# Patient Record
Sex: Female | Born: 1995 | Hispanic: Yes | Marital: Single | Smoking: Never smoker
Health system: Southern US, Community
[De-identification: ages and names within clinical notes are randomized; demographics above are authoritative.]

## PROBLEM LIST (undated history)

## (undated) ENCOUNTER — Inpatient Hospital Stay (HOSPITAL_COMMUNITY): Payer: Self-pay

## (undated) DIAGNOSIS — Z789 Other specified health status: Secondary | ICD-10-CM

## (undated) HISTORY — PX: NO PAST SURGERIES: SHX2092

---

## 2015-02-18 NOTE — L&D Delivery Note (Signed)
Delivery Note  Pt progressed steadily through labor.  Shortly after receiving her epidural, she was C/C?+2 w/BBOW. AROM w/clear fluid.  After a 3 minute 2nd stage, at 1:54 AM a viable female was delivered via  (Presentation: ;  ).  APGAR: ROA, loose nuchal cord delivered through ; weight pending.  After 1 minutes, the cord was clamped and cut. 40 units of pitocin diluted in 1000cc LR was infused rapidly IV.  The placenta separated spontaneously and delivered via CCT and maternal pushing effort.  It was inspected and appears to be intact with a 3 VC.   Anesthesia:  epidural Episiotomy:  none Lacerations:  1st degree perineal, no sutures Suture Repair: n/a Est. Blood Loss (mL):100    Mom to postpartum.  Baby to Couplet care / Skin to Skin   The above by Dr. Evelene CroonSantos under my direct supervision. Marland Kitchen.  CRESENZO-DISHMAN,Randon Somera 12/29/2015, 1:58 AM

## 2015-05-26 ENCOUNTER — Encounter (HOSPITAL_COMMUNITY): Payer: Self-pay | Admitting: *Deleted

## 2015-05-26 ENCOUNTER — Inpatient Hospital Stay (HOSPITAL_COMMUNITY)
Admission: AD | Admit: 2015-05-26 | Discharge: 2015-05-27 | Disposition: A | Discharge status: Home / Self Care | Payer: Self-pay | Source: Ambulatory Visit | Attending: Obstetrics & Gynecology | Admitting: Obstetrics & Gynecology

## 2015-05-26 DIAGNOSIS — K529 Noninfective gastroenteritis and colitis, unspecified: Secondary | ICD-10-CM

## 2015-05-26 DIAGNOSIS — Z3491 Encounter for supervision of normal pregnancy, unspecified, first trimester: Secondary | ICD-10-CM

## 2015-05-26 DIAGNOSIS — R109 Unspecified abdominal pain: Secondary | ICD-10-CM

## 2015-05-26 DIAGNOSIS — O218 Other vomiting complicating pregnancy: Secondary | ICD-10-CM | POA: Insufficient documentation

## 2015-05-26 DIAGNOSIS — E860 Dehydration: Secondary | ICD-10-CM

## 2015-05-26 DIAGNOSIS — Z3A14 14 weeks gestation of pregnancy: Secondary | ICD-10-CM | POA: Insufficient documentation

## 2015-05-26 DIAGNOSIS — O26899 Other specified pregnancy related conditions, unspecified trimester: Secondary | ICD-10-CM

## 2015-05-26 DIAGNOSIS — O26892 Other specified pregnancy related conditions, second trimester: Secondary | ICD-10-CM | POA: Insufficient documentation

## 2015-05-26 HISTORY — DX: Other specified health status: Z78.9

## 2015-05-26 LAB — URINALYSIS, ROUTINE W REFLEX MICROSCOPIC
BILIRUBIN URINE: NEGATIVE
Glucose, UA: NEGATIVE mg/dL
Hgb urine dipstick: NEGATIVE
KETONES UR: 15 mg/dL — AB
Leukocytes, UA: NEGATIVE
Nitrite: NEGATIVE
PH: 5 (ref 5.0–8.0)
Protein, ur: NEGATIVE mg/dL
Specific Gravity, Urine: >1.03 — ABNORMAL HIGH (ref 1.005–1.030)

## 2015-05-26 LAB — POCT PREGNANCY, URINE: Preg Test, Ur: POSITIVE — AB

## 2015-05-26 NOTE — MAU Note (Addendum)
Nausea past wk and unable to eat. Feel bad. Denies vag bleeding. Sometimes sees blood when vomits from vomiting so much. Some abd pain which i think is from throwing up so much. Some cramping in lower abd last night but now just sore from vomiting. Had chills and boyfriend felt pt was hot earlier but did not take temperature

## 2015-05-27 ENCOUNTER — Encounter (HOSPITAL_COMMUNITY): Payer: Self-pay | Admitting: Student

## 2015-05-27 ENCOUNTER — Inpatient Hospital Stay (HOSPITAL_COMMUNITY): Payer: Self-pay

## 2015-05-27 DIAGNOSIS — K529 Noninfective gastroenteritis and colitis, unspecified: Secondary | ICD-10-CM

## 2015-05-27 LAB — COMPREHENSIVE METABOLIC PANEL
ALK PHOS: 62 U/L (ref 38–126)
ALT: 20 U/L (ref 14–54)
ANION GAP: 6 (ref 5–15)
AST: 22 U/L (ref 15–41)
Albumin: 4.1 g/dL (ref 3.5–5.0)
BILIRUBIN TOTAL: 0.5 mg/dL (ref 0.3–1.2)
BUN: 6 mg/dL (ref 6–20)
CO2: 23 mmol/L (ref 22–32)
Calcium: 9.4 mg/dL (ref 8.9–10.3)
Chloride: 107 mmol/L (ref 101–111)
Creatinine, Ser: 0.54 mg/dL (ref 0.44–1.00)
GFR calc Af Amer: >60 mL/min (ref >60)
GLUCOSE: 85 mg/dL (ref 65–99)
POTASSIUM: 3.6 mmol/L (ref 3.5–5.1)
Sodium: 136 mmol/L (ref 135–145)
TOTAL PROTEIN: 7.6 g/dL (ref 6.5–8.1)

## 2015-05-27 LAB — CBC
HEMATOCRIT: 39.7 % (ref 36.0–46.0)
HEMOGLOBIN: 13.9 g/dL (ref 12.0–15.0)
MCH: 29.9 pg (ref 26.0–34.0)
MCHC: 35 g/dL (ref 30.0–36.0)
MCV: 85.4 fL (ref 78.0–100.0)
Platelets: 242 10*3/uL (ref 150–400)
RBC: 4.65 MIL/uL (ref 3.87–5.11)
RDW: 12.1 % (ref 11.5–15.5)
WBC: 12.1 10*3/uL — ABNORMAL HIGH (ref 4.0–10.5)

## 2015-05-27 LAB — WET PREP, GENITAL
CLUE CELLS WET PREP: NONE SEEN
Sperm: NONE SEEN
TRICH WET PREP: NONE SEEN
Yeast Wet Prep HPF POC: NONE SEEN

## 2015-05-27 LAB — HCG, QUANTITATIVE, PREGNANCY: hCG, Beta Chain, Quant, S: 226900 m[IU]/mL — ABNORMAL HIGH (ref <5)

## 2015-05-27 LAB — HIV ANTIBODY (ROUTINE TESTING W REFLEX): HIV Screen 4th Generation wRfx: NONREACTIVE

## 2015-05-27 LAB — ABO/RH: ABO/RH(D): B POS

## 2015-05-27 MED ORDER — SODIUM CHLORIDE 0.9 % IV SOLN
8.0000 mg | Freq: Once | INTRAVENOUS | Status: AC
  Administered 2015-05-27: 8 mg via INTRAVENOUS
  Filled 2015-05-27: qty 4

## 2015-05-27 MED ORDER — DEXTROSE 5 % IN LACTATED RINGERS IV BOLUS
1000.0000 mL | Freq: Once | INTRAVENOUS | Status: AC
  Administered 2015-05-27: 1000 mL via INTRAVENOUS

## 2015-05-27 MED ORDER — PROMETHAZINE HCL 25 MG PO TABS: 25.0000 mg | ORAL_TABLET | Freq: Four times a day (QID) | ORAL | Status: DC | PRN

## 2015-05-27 NOTE — Progress Notes (Signed)
Written and verbal d/c instructions given with help of spanish interpreter Raquel, and pt voices understanding.

## 2015-05-27 NOTE — MAU Provider Note (Signed)
History     CSN: [id number]  Arrival date and time: 05/26/15 2230   First Provider Initiated Contact with Patient 05/27/15 0110       Chief Complaint  Patient presents with  . Emesis During Pregnancy  . Diarrhea  . Abdominal Cramping   HPI  Anna Freeman is a 20 y.o. G3P1001 at [redacted]w[redacted]d by LMP who presents with n/v/d & abdominal cramping. Symptoms began 2 days ago. States has vomited 12 times today. Reports 4 episodes of watery stool today. Abdominal cramping since yesterday - states feels "sore" from vomiting. Denies fever, dysuria, vaginal bleeding, vaginal discharge. States thought she had a fever today but when she checked her temp it was normal.  Has first prenatal at [facility] scheduled for next Wednesday.  Denies sick contacts.   OB History    Gravida Para Term Preterm AB TAB SAB Ectopic Multiple Living   0 0 0 0 0 0 1      Past Medical History  Diagnosis Date  . Medical history non-contributory     Past Surgical History  Procedure Laterality Date  . No past surgeries      Family History  Problem Relation Age of Onset  . Heart disease Sister     Social History  Substance Use Topics  . Smoking status: Never Smoker   . Smokeless tobacco: None  . Alcohol Use: No    Allergies: No Known Allergies  Prescriptions prior to admission  Medication Sig Dispense Refill Last Dose  . Prenatal Vit-Fe Fumarate-FA (PRENATAL MULTIVITAMIN) TABS tablet Take 1 tablet by mouth daily at 12 noon.   05/26/2015 at Unknown time    Review of Systems  Constitutional: Positive for chills. Negative for fever.  Gastrointestinal: Positive for nausea, vomiting, abdominal pain and diarrhea. Negative for heartburn, constipation and blood in stool.  Genitourinary: Negative.    Physical Exam   Blood pressure 121/86, pulse 99, temperature 97.7 F (36.5 C), resp. rate 18, height  (1.473 m), weight 114 lb 6.4 oz (51.891 kg), last menstrual period 02/17/2015.  Physical Exam   Nursing note and vitals reviewed. Constitutional: She is oriented to person, place, and time. She appears well-developed and well-nourished. No distress.  HENT:  Head: Normocephalic and atraumatic.  Eyes: Conjunctivae are normal. Right eye exhibits no discharge. Left eye exhibits no discharge. No scleral icterus.  Neck: Normal range of motion.  Cardiovascular: Normal rate, regular rhythm and normal heart sounds.   No murmur heard. Respiratory: Effort normal and breath sounds normal. No respiratory distress. She has no wheezes.  GI: Soft. Bowel sounds are normal. She exhibits no distension. There is no tenderness. There is no rebound and no guarding.  Genitourinary: Uterus is enlarged. Uterus is not tender. Cervix exhibits no motion tenderness.  Cervix closed  Neurological: She is alert and oriented to person, place, and time.  Skin: Skin is warm and dry. She is not diaphoretic.  Psychiatric: She has a normal mood and affect. Her behavior is normal. Judgment and thought content normal.    MAU Course  Procedures Results for orders placed or performed during the hospital encounter of 05/26/15 (from the past 24 hour(s))  Urinalysis, Routine w reflex microscopic (not at [facility])     Status: Abnormal   Collection Time: 05/26/15 11:00 PM  Result Value Ref Range   Color, Urine YELLOW YELLOW   APPearance CLEAR CLEAR   Specific Gravity, Urine >1.030 (H) 1.005 - 1.030   pH 5.0 5.0 -  8.0   Glucose, UA NEGATIVE NEGATIVE mg/dL   Hgb urine dipstick NEGATIVE NEGATIVE   Bilirubin Urine NEGATIVE NEGATIVE   Ketones, ur 15 (A) NEGATIVE mg/dL   Protein, ur NEGATIVE NEGATIVE mg/dL   Nitrite NEGATIVE NEGATIVE   Leukocytes, UA NEGATIVE NEGATIVE  Pregnancy, urine POC     Status: Abnormal   Collection Time: 05/26/15 11:03 PM  Result Value Ref Range   Preg Test, Ur POSITIVE (A) NEGATIVE  CBC     Status: Abnormal   Collection Time: 05/27/15  1:35 AM  Result Value Ref Range   WBC 12.1 (H) 4.0 - 10.5 K/uL    RBC 4.65 3.87 - 5.11 MIL/uL   Hemoglobin 13.9 12.0 - 15.0 g/dL   HCT 16.139.7 09.636.0 - 04.546.0 %   MCV 85.4 78.0 - 100.0 fL   MCH 29.9 26.0 - 34.0 pg   MCHC 35.0 30.0 - 36.0 g/dL   RDW 40.912.1 81.111.5 - 91.415.5 %   Platelets 242 150 - 400 K/uL  ABO/Rh     Status: None (Preliminary result)   Collection Time: 05/27/15  1:35 AM  Result Value Ref Range   ABO/RH(D) B POS   hCG, quantitative, pregnancy     Status: Abnormal   Collection Time: 05/27/15  1:35 AM  Result Value Ref Range   hCG, Beta Chain, Quant, S [id number] (H) <5 mIU/mL  Comprehensive metabolic panel     Status: None   Collection Time: 05/27/15  1:35 AM  Result Value Ref Range   Sodium 136 135 - 145 mmol/L   Potassium 3.6 3.5 - 5.1 mmol/L   Chloride 107 101 - 111 mmol/L   CO2 23 22 - 32 mmol/L   Glucose, Bld 85 65 - 99 mg/dL   BUN 6 6 - 20 mg/dL   Creatinine, Ser 2.130.54 0.44 - 1.00 mg/dL   Calcium 9.4 8.9 - 08.610.3 mg/dL   Total Protein 7.6 6.5 - 8.1 g/dL   Albumin 4.1 3.5 - 5.0 g/dL   AST 22 15 - 41 U/L   ALT 20 14 - 54 U/L   Alkaline Phosphatase 62 38 - 126 U/L   Total Bilirubin 0.5 0.3 - 1.2 mg/dL   GFR calc non Af Amer >60 >60 mL/min   GFR calc Af Amer >60 >60 mL/min   Anion gap 6 5 - 15  Wet prep, genital     Status: Abnormal   Collection Time: 05/27/15  2:54 AM  Result Value Ref Range   Yeast Wet Prep HPF POC NONE SEEN NONE SEEN   Trich, Wet Prep NONE SEEN NONE SEEN   Clue Cells Wet Prep HPF POC NONE SEEN NONE SEEN   WBC, Wet Prep HPF POC MODERATE (A) NONE SEEN   Sperm NONE SEEN    [place] Ob Comp Less 14 Wks  05/27/2015  CLINICAL DATA:  Nausea vomiting chills and cramping abdominal pain. EXAM: OBSTETRIC <14 WK US AND TRANSVAGINAL OB US TECHNIQUE: Both transabdominal and transvaginal ultrasound examinations were performed for complete evaluation of the gestation as well as the maternal uterus, adnexal regions, and pelvic cul-de-sac. Transvaginal technique was performed to assess early pregnancy. COMPARISON:  None. FINDINGS:  Intrauterine gestational sac: Single Yolk sac:  Yes Embryo:  Yes Cardiac Activity: Yes Heart Rate: 180  bpm MSD:   mm    w     d CRL:  23.4  mm   9 w   0 d  [place] EDC: 12/30/2015 Subchorionic hemorrhage:  None visualized. Maternal uterus/adnexae: Normal.  No abnormal fluid collections. IMPRESSION: Single living intrauterine gestation measuring 9 weeks 0 days by crown-rump length. Electronically Signed   By: Ellery Plunk M.D.   On: 05/27/2015 02:38   US Ob Transvaginal  05/27/2015  CLINICAL DATA:  Nausea vomiting chills and cramping abdominal pain. EXAM: OBSTETRIC <14 WK [place] AND TRANSVAGINAL OB US TECHNIQUE: Both transabdominal and transvaginal ultrasound examinations were performed for complete evaluation of the gestation as well as the maternal uterus, adnexal regions, and pelvic cul-de-sac. Transvaginal technique was performed to assess early pregnancy. COMPARISON:  None. FINDINGS: Intrauterine gestational sac: Single Yolk sac:  Yes Embryo:  Yes Cardiac Activity: Yes Heart Rate: 180  bpm MSD:   mm    w     d CRL:  23.4  mm   9 w   0 d                  [place] EDC: 12/30/2015 Subchorionic hemorrhage:  None visualized. Maternal uterus/adnexae: Normal.  No abnormal fluid collections. IMPRESSION: Single living intrauterine gestation measuring 9 weeks 0 days by crown-rump length. Electronically Signed   By: Ellery Plunk M.D.   On: 05/27/2015 02:38    MDM UPT positive IV D5LR bolus Zofran 8 mg IV Unable to doppler FHT, will send for ultrasound Ultrasound shows SIUP at 9 weeks - will update EDD Pt not observed vomiting or having diarrhea while in MAU Assessment and Plan  A: 1. Acute gastroenteritis   2. Abdominal pain in pregnancy   3. Normal IUP (intrauterine pregnancy) on prenatal ultrasound, first trimester   4. Dehydration     P: Discharge home Rx phenergan Keep f/u with [facility] Discussed reasons to return to MAU Advance diet as tolerated  Judeth Horn 05/27/2015, 1:02 AM

## 2015-05-27 NOTE — Progress Notes (Signed)
Assisted Registration with interpretation of questions for admission.  Also assisted RN with interpretation of triage questions.  Spanish Interpreter

## 2015-05-27 NOTE — Progress Notes (Signed)
'  blind swab' by NP to obtain specimens. Anna Freeman spanish interpreter helping

## 2015-05-27 NOTE — Discharge Instructions (Signed)
Gastroenteritis viral (Viral Gastroenteritis) La gastroenteritis viral tambin es conocida como gripe del [place]. Este trastorno [employer] y el tubo digestivo. Puede causar diarrea y vmitos repentinos. La enfermedad generalmente dura entre 3 y [address]. La [employer] de las personas desarrolla una respuesta inmunolgica. Con el tiempo, esto elimina el virus. Mientras se desarrolla esta respuesta natural, el virus puede afectar en forma importante su salud.  CAUSAS Muchos virus diferentes pueden causar gastroenteritis, por ejemplo el rotavirus o el norovirus. Estos virus pueden contagiarse al consumir alimentos o agua contaminados. Tambin puede contagiarse al compartir utensilios u otros artculos personales con una persona infectada o al tocar una superficie contaminada.  SNTOMAS Los sntomas ms comunes son diarrea y vmitos. Estos problemas pueden causar una prdida grave de lquidos corporales(deshidratacin) y un desequilibrio de sales corporales(electrolitos). Otros sntomas pueden ser:   Grant Ruts.  Dolor de [place].  Fatiga.  Dolor abdominal. DIAGNSTICO  El mdico podr hacer el diagnstico de gastroenteritis viral basndose en los sntomas y el examen fsico Tambin pueden tomarle una muestra de materia fecal para diagnosticar la presencia de virus u otras infecciones.  TRATAMIENTO Esta enfermedad generalmente desaparece sin tratamiento. Los tratamientos estn dirigidos a Social research officer, government. Los casos ms graves de gastroenteritis viral implican vmitos tan intensos que no es posible retener lquidos. En [employer], los lquidos deben administrarse a travs de una va intravenosa (IV).  INSTRUCCIONES PARA EL CUIDADO DOMICILIARIO  Beba suficientes lquidos para mantener la orina clara o de color amarillo plido. Beba pequeas cantidades de lquido con frecuencia y aumente la cantidad segn la tolerancia.  Pida instrucciones especficas a su mdico con respecto a la  rehidratacin.  Evite:  Alimentos que Nurse, adult.  Alcohol.  Gaseosas.  TabacoVista Lawman.  Bebidas con cafena.  Lquidos muy calientes o fros.  Alimentos muy grasos.  Comer demasiado a Licensed conveyancer.  Productos lcteos hasta 24 a 48 horas despus de que se detenga la diarrea.  Puede consumir probiticos. Los probiticos son cultivos activos de bacterias beneficiosas. Pueden disminuir la cantidad y el nmero de deposiciones diarreicas en el adulto. Se encuentran en los yogures con cultivos activos y en los suplementos.  Lave bien sus manos para evitar que se disemine el virus.  Slo tome medicamentos de venta libre o recetados para Primary school teacher, las molestias o bajar la fiebre segn las indicaciones de su mdico. No administre aspirina a los nios. Los medicamentos antidiarreicos no son recomendables.  Consulte a su mdico si puede seguir tomando sus medicamentos recetados o de [employer].  Cumpla con todas las visitas de control, segn le indique su mdico. SOLICITE ATENCIN MDICA DE INMEDIATO SI:  No puede retener lquidos.  No hay emisin de orina durante 6 a 8 horas.  Le falta el aire.  Observa sangre en el vmito (se ve como caf molido) o en la materia fecal.  Siente dolor abdominal que empeora o se concentra en una zona pequea (se localiza).  Tiene nuseas o vmitos persistentes.  Tiene fiebre.  El paciente es un nio menor de 3 meses y [place].  El paciente es un nio mayor de 3 meses, tiene fiebre y sntomas persistentes.  El paciente es un nio mayor de 3 meses y tiene fiebre y sntomas que empeoran repentinamente.  El paciente es un beb y no tiene lgrimas cuando llora. ASEGRESE QUE:   Comprende estas instrucciones.  Controlar su enfermedad.  Solicitar ayuda inmediatamente si no mejora o si empeora.   Esta  informacin no tiene Theme park managercomo fin reemplazar el consejo del mdico. Asegrese de hacerle al mdico cualquier pregunta que  tenga.   Document Released: 02/03/2005 Document Revised: 04/28/2011 Elsevier Interactive Patient Education 2016 [employer]   Dolor abdominal durante el embarazo (Abdominal Pain During Pregnancy) El dolor de vientre (abdominal) es habitual durante el embarazo. Generalmente no se trata de un problema grave. Otras veces puede ser un signo de que algo no anda bien. Siempre comunquese con su mdico si tiene dolor abdominal. CUIDADOS EN EL HOGAR Controle el dolor para ver si hay cambios. Las indicaciones que siguen pueden ayudarla a sentirse mejor:  Hospital doctorotenga sexo (relaciones sexuales) ni se coloque nada dentro de la vagina hasta que se sienta mejor.  Haga reposo hasta que el dolor se calme.  Si siente ganas de vomitar (nuseas ) beba lquidos claros. No consuma alimentos slidos hasta que se sienta mejor.  Slo tome los medicamentos que le haya indicado su mdico.  Cumpla con las visitas al mdico segn las indicaciones. SOLICITE AYUDA DE INMEDIATO SI:   Tiene un sangrado, pierde lquido o elimina trozos de tejido por la vagina.  Siente ms dolor o clicos.  Comienza a vomitar.  Siente dolor al orinar u observa sangre en la orina.  Tiene fiebre.  No siente que el beb se mueva mucho.  Se siente muy dbil o cree que va a desmayarse.  Tiene dificultad para respirar con o sin dolor en el vientre.  Siente un dolor de cabeza muy intenso y Engineer, miningdolor en el vientre.  Observa que sale un lquido por la vagina y tiene dolor abdominal.  La materia fecal es lquida (diarrea).  El dolor en el viente no desaparece, o empeora, luego de hacer reposo. ASEGRESE DE QUE:   Comprende estas instrucciones.  Controlar su afeccin.  Recibir ayuda de inmediato si no mejora o si empeora.   Esta informacin no tiene Theme park managercomo fin reemplazar el consejo del mdico. Asegrese de hacerle al mdico cualquier pregunta que tenga.   Document Released: 10/16/2010 Document Revised: 10/06/2012 Elsevier  Interactive Patient Education [employer]2016 Elsevier Inc.

## 2015-05-28 LAB — GC/CHLAMYDIA PROBE AMP (~~LOC~~) NOT AT ARMC
Chlamydia: POSITIVE — AB
Neisseria Gonorrhea: NEGATIVE

## 2015-05-29 ENCOUNTER — Telehealth (HOSPITAL_COMMUNITY): Payer: Self-pay | Admitting: *Deleted

## 2015-05-29 DIAGNOSIS — A749 Chlamydial infection, unspecified: Secondary | ICD-10-CM

## 2015-05-29 DIAGNOSIS — O98811 Other maternal infectious and parasitic diseases complicating pregnancy, first trimester: Principal | ICD-10-CM

## 2015-05-29 MED ORDER — AZITHROMYCIN 500 MG PO TABS: ORAL_TABLET | ORAL | Status: DC

## 2015-05-29 NOTE — Telephone Encounter (Signed)
Telephone call to patient using Education officer, communityacific Interpreter # [phone].  Notified patient of positive chlamydia culture.  Verified partner has no drug allergies.  Rx routed to patient's pharmacy per protocol with one refill for partner's treatment.  Instructed patient to abstain from sex for seven days post treatment.  Report faxed to health department.

## 2015-06-07 ENCOUNTER — Other Ambulatory Visit (HOSPITAL_COMMUNITY): Payer: Self-pay | Admitting: Nurse Practitioner

## 2015-06-07 DIAGNOSIS — Z3A13 13 weeks gestation of pregnancy: Secondary | ICD-10-CM

## 2015-06-07 DIAGNOSIS — Z369 Encounter for antenatal screening, unspecified: Secondary | ICD-10-CM

## 2015-06-07 LAB — OB RESULTS CONSOLE ABO/RH: RH TYPE: POSITIVE

## 2015-06-07 LAB — OB RESULTS CONSOLE HEPATITIS B SURFACE ANTIGEN
HEP B S AG: UNDETERMINED
Hepatitis B Surface Ag: NEGATIVE

## 2015-06-07 LAB — OB RESULTS CONSOLE ANTIBODY SCREEN: Antibody Screen: NEGATIVE

## 2015-06-07 LAB — OB RESULTS CONSOLE RUBELLA ANTIBODY, IGM: Rubella: IMMUNE

## 2015-06-07 LAB — OB RESULTS CONSOLE HIV ANTIBODY (ROUTINE TESTING): HIV: NONREACTIVE

## 2015-06-07 LAB — OB RESULTS CONSOLE RPR: RPR: NONREACTIVE

## 2015-06-15 ENCOUNTER — Encounter (HOSPITAL_COMMUNITY): Payer: Self-pay | Admitting: Nurse Practitioner

## 2015-06-26 ENCOUNTER — Encounter (HOSPITAL_COMMUNITY): Payer: Self-pay

## 2015-06-26 ENCOUNTER — Ambulatory Visit (HOSPITAL_COMMUNITY)
Admission: RE | Admit: 2015-06-26 | Discharge: 2015-06-26 | Disposition: A | Discharge status: Home / Self Care | Payer: Self-pay | Source: Ambulatory Visit | Attending: Nurse Practitioner | Admitting: Nurse Practitioner

## 2015-06-26 VITALS — BP 116/82 | HR 84 | Wt 111.0 lb

## 2015-06-26 DIAGNOSIS — O352XX0 Maternal care for (suspected) hereditary disease in fetus, not applicable or unspecified: Secondary | ICD-10-CM | POA: Insufficient documentation

## 2015-06-26 DIAGNOSIS — Z36 Encounter for antenatal screening of mother: Secondary | ICD-10-CM | POA: Insufficient documentation

## 2015-06-26 DIAGNOSIS — Z8249 Family history of ischemic heart disease and other diseases of the circulatory system: Secondary | ICD-10-CM | POA: Insufficient documentation

## 2015-06-26 DIAGNOSIS — O352XX1 Maternal care for (suspected) hereditary disease in fetus, fetus 1: Secondary | ICD-10-CM

## 2015-06-26 DIAGNOSIS — Z3A13 13 weeks gestation of pregnancy: Secondary | ICD-10-CM | POA: Insufficient documentation

## 2015-06-26 DIAGNOSIS — Z315 Encounter for genetic counseling: Secondary | ICD-10-CM | POA: Insufficient documentation

## 2015-06-26 DIAGNOSIS — Z369 Encounter for antenatal screening, unspecified: Secondary | ICD-10-CM

## 2015-06-26 NOTE — Progress Notes (Signed)
Genetic Counseling  Visit Summary Note  Appointment Date: 06/26/2015 Referred By: Nicholaus Bloom, NP  Date of Birth: 07-Aug-1995  Pregnancy history: G3P1001 Estimated Date of Delivery: 12/30/15 Estimated Gestational Age: [redacted]w[redacted]d I met with [name] genetic counseling because of a family history of a congenital heart defect.  In summary:  Discussed family history of CHD  Reviewed probable multifactorial etiology and the associated 1-2% risk of recurrence  Discussed options of screening / testing  Patient elected to have an NT with First trimester screening today  She is scheduled to return for a detailed anatomy u/s and fetal echocardiogram  Discussed general population carrier screening options  Hemoglobinopathies-screening declined  We began by reviewing the family history in detail. [name] that her sister was noted shortly after birth to have a heart defect, specific type unknown. [name] that her sister was followed yearly by a cardiologist and surgery was planned for late adolescence; however, prior to that time, the difference apparently resolved.  This sibling is now 180years old and has no health concerns. She is said to have normal intellect and physical appearance. Medical records were not available to verify the reported history. The family histories were otherwise found to be noncontributory for birth defects, intellectual disability, and known genetic conditions. Without further information regarding the provided family history, an accurate genetic risk cannot be calculated. Further genetic counseling is warranted if more information is obtained.  Ms. Anna Altamiranowas counseled that congenital heart defects occur at a frequency of approximately 1 in 100 births, when minor cases are included in the calculation of disease incidence. We discussed that congenital heart defects can be genetic, environmental, or  multifactorial in etiology. This patient was counseled that congenital heart defects are prominent in chromosome conditions, particularly autosomal trisomies, Turner syndrome, and microdeletions (22q11 deletion syndrome), with approximately 15-20% of congenital heart defects resulting from an underlying chromosome condition. In addition, single gene conditions are also a common genetic cause of congenital heart defects. To date, more than 500 single gene disorders are known to have congenital heart disease as a described feature.   Environmental causes for congenital heart defects are well described and many teratogens are known to be linked to congenital heart disease (alcohol, specific medications, Rubella). The majority of the remainder of cases (>70%) are classified as multifactorial, referring to the condition being caused by a combination of both environmental and genetic causes. Given that the remainder of [name] medical history is noncontributory, she has no developmental or behavioral concerns, and has no described dysmorphisms, we discussed that it is most likely that her congenital heart difference was nonsyndromic. We reviewed multifactorial inheritance and that literature supports that the risk of recurrence for second degree relatives for an unspecified congenital heart defect is approximately 1-2%. She was counseled that if her sister has a specific genetic condition that is currently undiagnosed, the risk of recurrence depends on the inheritance of the condition.   We reviewed the availability of a nuchal translucency ultrasound, a detailed ultrasound, and fetal echocardiogram. We reviewed that fetuses with an increased nuchal translucency measurement have a higher risk of congenital heart disease, thus this ultrasound measurement provides early screening for congenital heart defects.  After thoughtful consideration of her options, [name] to have a  nuchal translucency ultrasound and First trimester screening today.  She also elected to return for a detailed anatomy ultrasound and fetal echocardiogram. These  appointments were scheduled today.    [name] was provided with written information regarding sickle cell anemia (SCA) including the carrier frequency and incidence in the Hispanic population, the availability of carrier testing and prenatal diagnosis if indicated.  In addition, we discussed that hemoglobinopathies are routinely screened for as part of the [place] newborn screening panel.  She declined hemoglobin electrophoresis today.   [name] denied exposure to environmental toxins or chemical agents. She denied the use of alcohol, tobacco or street drugs. She denied significant viral illnesses during the course of her pregnancy. Her medical and surgical histories were noncontributory.   I counseled this patient regarding the above risks and available options.  The approximate face-to-face time with the genetic counselor was 38 minutes.  Filbert Schilder, MS Certified Genetic Counselor

## 2015-07-10 ENCOUNTER — Other Ambulatory Visit (HOSPITAL_COMMUNITY): Payer: Self-pay

## 2015-07-31 ENCOUNTER — Inpatient Hospital Stay (HOSPITAL_COMMUNITY): Payer: Self-pay

## 2015-07-31 ENCOUNTER — Inpatient Hospital Stay (HOSPITAL_COMMUNITY)
Admission: AD | Admit: 2015-07-31 | Discharge: 2015-07-31 | Disposition: A | Discharge status: Home / Self Care | Payer: Self-pay | Source: Ambulatory Visit | Attending: Obstetrics and Gynecology | Admitting: Obstetrics and Gynecology

## 2015-07-31 ENCOUNTER — Ambulatory Visit (HOSPITAL_COMMUNITY): Admission: RE | Admit: 2015-07-31 | Payer: Self-pay | Source: Ambulatory Visit

## 2015-07-31 ENCOUNTER — Encounter (HOSPITAL_COMMUNITY): Payer: Self-pay | Admitting: *Deleted

## 2015-07-31 DIAGNOSIS — Z1389 Encounter for screening for other disorder: Secondary | ICD-10-CM

## 2015-07-31 DIAGNOSIS — R102 Pelvic and perineal pain: Secondary | ICD-10-CM | POA: Insufficient documentation

## 2015-07-31 DIAGNOSIS — O4692 Antepartum hemorrhage, unspecified, second trimester: Secondary | ICD-10-CM

## 2015-07-31 DIAGNOSIS — O26892 Other specified pregnancy related conditions, second trimester: Secondary | ICD-10-CM | POA: Insufficient documentation

## 2015-07-31 DIAGNOSIS — O209 Hemorrhage in early pregnancy, unspecified: Secondary | ICD-10-CM | POA: Insufficient documentation

## 2015-07-31 DIAGNOSIS — Z3A18 18 weeks gestation of pregnancy: Secondary | ICD-10-CM | POA: Insufficient documentation

## 2015-07-31 DIAGNOSIS — Z3689 Encounter for other specified antenatal screening: Secondary | ICD-10-CM

## 2015-07-31 DIAGNOSIS — Z8279 Family history of other congenital malformations, deformations and chromosomal abnormalities: Secondary | ICD-10-CM | POA: Insufficient documentation

## 2015-07-31 LAB — URINALYSIS, ROUTINE W REFLEX MICROSCOPIC
Bilirubin Urine: NEGATIVE
Glucose, UA: NEGATIVE mg/dL
Ketones, ur: NEGATIVE mg/dL
Leukocytes, UA: NEGATIVE
Nitrite: NEGATIVE
PROTEIN: NEGATIVE mg/dL
Specific Gravity, Urine: 1.015 (ref 1.005–1.030)
pH: 6 (ref 5.0–8.0)

## 2015-07-31 LAB — URINE MICROSCOPIC-ADD ON
Bacteria, UA: NONE SEEN
WBC, UA: NONE SEEN WBC/hpf (ref 0–5)

## 2015-07-31 LAB — WET PREP, GENITAL
Clue Cells Wet Prep HPF POC: NONE SEEN
Sperm: NONE SEEN
Trich, Wet Prep: NONE SEEN
YEAST WET PREP: NONE SEEN

## 2015-07-31 NOTE — Discharge Instructions (Signed)
Hemorragia vaginal durante el embarazo (segundo trimestre) (Vaginal Bleeding During Pregnancy, Second Trimester) Durante el embarazo es relativamente frecuente que se presente una pequea hemorragia (manchas). Esta situacin generalmente mejora por s misma. Hay diversos factores que pueden causar hemorragia o Games developer. Algunas manchas pueden estar relacionadas al [employer] y otras no. A veces, la hemorragia es normal y no es un problema. Sin embargo, la hemorragia tambin puede ser un signo de algo grave. Debe informar a su mdico de inmediato si tiene alguna hemorragia vaginal. Algunas causas posibles de hemorragia vaginal durante el segundo trimestre incluyen:  Infeccin, inflamacin o crecimientos anormales en el cuello del tero.  La placenta cubre parcial o completamente la abertura del cuello del tero (placenta previa).  La placenta puede haberse separado del tero (abrupcin placentaria).  Usted puede estar sufriendo un trabajo de parto prematuro.  Es probable que el cuello del tero no sea lo suficientemente resistente para Pharmacologist un beb dentro del tero (insuficiencia cervical).  Se pueden haber desarrollado pequeos quistes en el tero en lugar de tejido de embarazo (embarazo molar). INSTRUCCIONES PARA EL CUIDADO EN EL HOGAR  Controle su afeccin para ver si hay cambios. Las siguientes indicaciones ayudarn a Psychologist, educational [employer] pueda sentir:  Siga las indicaciones del mdico para restringir su actividad. Si el mdico le indica descanso en la cama, debe quedarse en la cama y levantarse solo para ir al bao. No obstante, el mdico puede permitirle que continu con tareas livianas.  Si es necesario, organcese para que alguien le ayude con las actividades y responsabilidades cotidianas mientras est en cama.  Lleve un registro de la cantidad y la saturacin de las toallas higinicas que Landscape architect. Anote este dato.  No use tampones.No se haga duchas  vaginales.  No tenga relaciones sexuales u orgasmos hasta que el mdico la autorice.  Si elimina tejido por la vagina, gurdelo para mostrrselo al [employer].  [place] solo medicamentos de venta libre o recetados, segn las indicaciones del mdico.  No tome aspirina, ya que puede causar hemorragias.  No realice ejercicio ni ninguna actividad intensa, ni levante objetos pesados sin el permiso de su mdico.  Cumpla con todas las visitas de control, segn le indique su mdico. SOLICITE ATENCIN MDICA SI:  Tiene un sangrado vaginal en cualquier momento del embarazo.  Tiene calambres o dolores de [place].  Tiene fiebre que los medicamentos no Sports coach. SOLICITE ATENCIN MDICA DE INMEDIATO SI:   Siente calambres intensos en la espalda o en el vientre (abdomen).  Siente contracciones.  Tiene escalofros.  Elimina cogulos grandes o tejido por la vagina.  La hemorragia aumenta.  Si se siente mareada, dbil o se desmaya.  Tiene una prdida importante o sale lquido a borbotones por la vagina. ASEGRESE DE QUE:  Comprende estas instrucciones.  Controlar su afeccin.  Recibir ayuda de inmediato si no mejora o si empeora.   Esta informacin no tiene Theme park manager el consejo del mdico. Asegrese de hacerle al mdico cualquier pregunta que tenga.   Document Released: 11/13/2004 Document Revised: 02/08/2013 Elsevier Interactive Patient Education 2016 [employer]. Reposo plvico  (Pelvic Rest) El reposo plvico se recomienda a las mujeres cuando:   La placenta cubre parcial o completamente la abertura del cuello del tero (placenta previa).  Hay sangrado entre la pared del tero y el saco amnitico en el primer trimestre (hemorragia subcorinica).  El cuello uterino comienza a abrirse sin iniciarse el trabajo de parto (cuello uterino incompetente, insuficiencia cervical).  El Smileytrabajo de parto se inicia muy pronto (parto prematuro). INSTRUCCIONES PARA EL CUIDADO  EN EL HOGAR   No tenga relaciones sexuales, estimulacin, ni orgasmos.  No use tampones, no se haga duchas vaginales ni coloque ningn objeto en la vagina.  No levante objetos que pesen ms de 10 libras (4,5 kg).  Evite las actividades extenuantes o tensionar los msculos de la pelvis. SOLICITE ATENCIN MDICA SI:   Tiene un sangrado vaginal durante el embarazo. Considrelo como una posible emergencia.  Siente clicos en la zona baja del estmago (ms fuertes que los clicos menstruales).  Nota flujo vaginal (acuoso, con moco o [place]).  Siente un dolor en la espalda leve y sordo.  Tiene contracciones regulares o endurecimiento del tero. SOLICITE ATENCIN MDICA DE INMEDIATO SI:  Observa sangrado vaginal y tiene placenta previa.    Esta informacin no tiene Theme park managercomo fin reemplazar el consejo del mdico. Asegrese de hacerle al mdico cualquier pregunta que tenga.   Document Released: 10/29/2011 Elsevier Interactive Patient Education [employer]2016 Elsevier Inc.

## 2015-07-31 NOTE — MAU Note (Signed)
PT SAYS WHEN SHE  WENT TO B-ROOM  IN MAU -  WIPED  - SHE SAW RED BLOOD   ON PAPER.           PAIN IN ABD  STARTED LAST WEEK-  YESTERDAY PAIN BECAME   WORSE.       PAIN IS 5.   LAST SEX-   Sunday NIGHT.  PNC-  HD     NO BLEEDING ON PERINEUM  NOW.     USING  DEBBIE- INTERPRETER.

## 2015-07-31 NOTE — MAU Provider Note (Signed)
History     CSN: [id number]  Arrival date and time: 07/31/15 0554  First patient contact 0630 Spanish interpreter here for visit    Chief Complaint  Patient presents with  . Abdominal Pain  . Vaginal Bleeding   HPI Comments: Anna Freeman is a 20 y.o. G3P1011 at [redacted]w[redacted]d presenting with one week history of suprapubic cramping pain and onset this morning of vaginal bleeding. Last intercourse 2 nights ago. Anatomic [place] is scheduled for later today.    Abdominal Pain This is a recurrent problem. The current episode started 1 to 4 weeks ago. The onset quality is gradual. The problem occurs intermittently. The problem has been unchanged. The pain is located in the suprapubic region (No pain at present). The pain is at a severity of 3/10. The pain is mild. The quality of the pain is colicky. The abdominal pain does not radiate. Associated symptoms include nausea. Pertinent negatives include no dysuria, fever, frequency, headaches or hematuria. Nothing aggravates the pain. The pain is relieved by nothing. She has tried nothing for the symptoms.  Vaginal Bleeding The patient's primary symptoms include pelvic pain and vaginal bleeding. The patient's pertinent negatives include no genital itching, genital lesions, genital odor or vaginal discharge. This is a new problem. The current episode started today. The problem occurs intermittently. The problem has been unchanged. The pain is mild. The problem affects both sides. Associated symptoms include abdominal pain and nausea. Pertinent negatives include no back pain, chills, dysuria, fever, flank pain, frequency, headaches, hematuria or urgency. The vaginal discharge was normal. The vaginal bleeding is spotting (dark red mucusy blood ). She has not been passing clots. She has not been passing tissue.   Pregnancy Course:  Prenatal care at [facility] significant for + chlamydia which was and family hx CHD> had genetic counseling and nl NT. BEGA by CRL at  [redacted]w[redacted]d. B pos blood type.   OB History  Gravida Para Term Preterm AB SAB TAB Ectopic Multiple Living  3 0 0 0 1 1 0 0 0 1     # Outcome Date GA Lbr Len/2nd Weight Sex Delivery Anes PTL Lv  3 Current           2 Gravida      Vag-Spont     1 SAB                Past Medical History  Diagnosis Date  . Medical history non-contributory     Past Surgical History  Procedure Laterality Date  . No past surgeries      Family History  Problem Relation Age of Onset  . Heart disease Sister     Social History  Substance Use Topics  . Smoking status: Never Smoker   . Smokeless tobacco: None  . Alcohol Use: No    Allergies: No Known Allergies  Prescriptions prior to admission  Medication Sig Dispense Refill Last Dose  . Prenatal Vit-Fe Fumarate-FA (PRENATAL MULTIVITAMIN) TABS tablet Take 1 tablet by mouth daily at 12 noon.   07/30/2015 at Unknown time  . azithromycin (ZITHROMAX) 500 MG tablet Take two tablets by mouth once (Patient not taking: Reported on 06/26/2015) 2 tablet 1 Not Taking  . promethazine (PHENERGAN) 25 MG tablet Take 1 tablet (25 mg total) by mouth every 6 (six) hours as needed for nausea or vomiting. (Patient not taking: Reported on 07/31/2015) 30 tablet 0 Taking    Review of Systems  Constitutional: Negative for fever, chills and malaise/fatigue.  Gastrointestinal: Positive for  nausea and abdominal pain.  Genitourinary: Positive for vaginal bleeding and pelvic pain. Negative for dysuria, urgency, frequency, hematuria, flank pain and vaginal discharge.  Musculoskeletal: Negative for back pain.  Neurological: Negative for headaches.   Physical Exam   Blood pressure 113/65, pulse 76, temperature 99.3 F (37.4 C), temperature source Oral, resp. rate 18, last menstrual period 02/17/2015.  Physical Exam  Nursing note and vitals reviewed. Constitutional: She is oriented to person, place, and time. She appears well-developed and well-nourished. She appears distressed.   HENT:  Head: Normocephalic.  Neck: Neck supple.  Cardiovascular: Normal rate.   Respiratory: Effort normal.  GI: Soft. There is no tenderness.  DT FHR 155; S=D  Genitourinary: Vagina normal. No vaginal discharge found.  Scant dark blood in vault; no active bleeding or discharge noted. No lesions. VE: cx long/closed  Neurological: She is alert and oriented to person, place, and time.  Skin: Skin is warm and dry.    MAU Course  Procedures Results for orders placed or performed during the hospital encounter of 07/31/15 (from the past 24 hour(s))  Urinalysis, Routine w reflex microscopic (not at [facility])     Status: Abnormal   Collection Time: 07/31/15  6:22 AM  Result Value Ref Range   Color, Urine YELLOW YELLOW   APPearance CLEAR CLEAR   Specific Gravity, Urine 1.015 1.005 - 1.030   pH 6.0 5.0 - 8.0   Glucose, UA NEGATIVE NEGATIVE mg/dL   Hgb urine dipstick LARGE (A) NEGATIVE   Bilirubin Urine NEGATIVE NEGATIVE   Ketones, ur NEGATIVE NEGATIVE mg/dL   Protein, ur NEGATIVE NEGATIVE mg/dL   Nitrite NEGATIVE NEGATIVE   Leukocytes, UA NEGATIVE NEGATIVE  Urine microscopic-add on     Status: Abnormal   Collection Time: 07/31/15  6:22 AM  Result Value Ref Range   Squamous Epithelial / LPF 0-5 (A) NONE SEEN   WBC, UA NONE SEEN 0 - 5 WBC/hpf   RBC / HPF 0-5 0 - 5 RBC/hpf   Bacteria, UA NONE SEEN NONE SEEN  Wet prep, genital     Status: Abnormal   Collection Time: 07/31/15  6:30 AM  Result Value Ref Range   Yeast Wet Prep HPF POC NONE SEEN NONE SEEN   Trich, Wet Prep NONE SEEN NONE SEEN   Clue Cells Wet Prep HPF POC NONE SEEN NONE SEEN   WBC, Wet Prep HPF POC FEW (A) NONE SEEN   Sperm NONE SEEN   GC/CT sent  No results found. Full anatomic US done. Per [place] tech all WNL with no evident etiology for bleeding. Final radiology report pending. Interpreter here. Will discharge to home with reassurance, bleeding precautions, pelvic rest. Will call if GC/CT TOC cultures positive.     Assessment and Plan  G3P1011 at [redacted]w[redacted]d 1. Encounter for routine screening for malformation using ultrasonics   2. Vaginal bleeding in pregnancy, second trimester   3. Encounter for fetal anatomic survey   4. [redacted] weeks gestation of pregnancy   5. Family history of congenital anomalies      Medication List    STOP taking these medications        azithromycin 500 MG tablet  Commonly known as:  ZITHROMAX     promethazine 25 MG tablet  Commonly known as:  PHENERGAN      TAKE these medications        prenatal multivitamin Tabs tablet  Take 1 tablet by mouth daily at 12 noon.        Follow-up  Information    [facility]   Why:  Keep your scheduled prenatal appointment   Contact information:   [address] [place] [id number] [phone]       Karle Desrosier 07/31/2015, 6:33 AM

## 2015-07-31 NOTE — MAU Note (Signed)
Pt presents complaining of lower abdominal pain since last week and when she went to the bathroom this am she had some bleeding. More than spotting when she wipes. Denies pain at this time. Denies abnormal discharge.

## 2015-08-01 LAB — GC/CHLAMYDIA PROBE AMP (~~LOC~~) NOT AT ARMC
Chlamydia: NEGATIVE
NEISSERIA GONORRHEA: NEGATIVE

## 2015-10-25 ENCOUNTER — Inpatient Hospital Stay (HOSPITAL_COMMUNITY)
Admission: AD | Admit: 2015-10-25 | Discharge: 2015-10-25 | Disposition: A | Discharge status: Home / Self Care | Payer: Self-pay | Source: Ambulatory Visit | Attending: Obstetrics and Gynecology | Admitting: Obstetrics and Gynecology

## 2015-10-25 ENCOUNTER — Inpatient Hospital Stay (HOSPITAL_COMMUNITY): Payer: Self-pay

## 2015-10-25 ENCOUNTER — Encounter (HOSPITAL_COMMUNITY): Payer: Self-pay | Admitting: *Deleted

## 2015-10-25 DIAGNOSIS — W19XXXA Unspecified fall, initial encounter: Secondary | ICD-10-CM

## 2015-10-25 DIAGNOSIS — W010XXA Fall on same level from slipping, tripping and stumbling without subsequent striking against object, initial encounter: Secondary | ICD-10-CM | POA: Insufficient documentation

## 2015-10-25 DIAGNOSIS — O9A213 Injury, poisoning and certain other consequences of external causes complicating pregnancy, third trimester: Secondary | ICD-10-CM

## 2015-10-25 DIAGNOSIS — S3991XA Unspecified injury of abdomen, initial encounter: Secondary | ICD-10-CM | POA: Insufficient documentation

## 2015-10-25 DIAGNOSIS — Y92091 Bathroom in other non-institutional residence as the place of occurrence of the external cause: Secondary | ICD-10-CM | POA: Insufficient documentation

## 2015-10-25 DIAGNOSIS — O26893 Other specified pregnancy related conditions, third trimester: Secondary | ICD-10-CM | POA: Insufficient documentation

## 2015-10-25 DIAGNOSIS — Z3A30 30 weeks gestation of pregnancy: Secondary | ICD-10-CM | POA: Insufficient documentation

## 2015-10-25 NOTE — MAU Note (Signed)
Urine in lab 

## 2015-10-25 NOTE — MAU Note (Signed)
Pt reports she fell last night. She ws giving her other child a bath and was trying to lift him out and  Slipped and hit her stomach on hte tub very hard. Not feeling any pain but MD told her to come in today. Good fetal movement reported.

## 2015-10-25 NOTE — Discharge Instructions (Signed)
Qu debo saber acerca de las lesiones durante el embarazo? (What Do I Need to Know About Injuries During Pregnancy?) Los traumatismos son la causa ms frecuente de lesin y Anna Anna Freeman, y tambin pueden causar daos importantes o la muerte del beb. En el vientre (tero), el beb est protegido por una bolsa llena de lquido (saco amnitico). Si se produce un traumatismo directo de alto impacto en el abdomen y la pelvis, el beb puede sufrir daos. Este tipo de traumatismo puede causar el desgarro del tero, la separacin de la placenta de la pared uterina (desprendimiento de la placenta) o la rotura del saco amnitico (ruptura de las Anna Anna Freeman). Estas lesiones pueden disminuir o interrumpir la irrigacin de sangre al beb, o provocar el trabajo de parto antes de lo previsto. Generalmente, las cadas menores y los accidentes automovilsticos de bajo impacto no daan al beb, incluso si usted sufre lesiones muy leves. QU TIPOS DE LESIONES PUEDEN AFECTAR [name]? Las causas ms frecuentes de lesin o muerte de un beb incluyen lo siguiente:  Cadas. Las cadas son ms frecuentes en el segundo y Geologist, engineering trimestre del Media planner. Los factores que aumentan el riesgo de cadas son:  Anna Anna Freeman.  Cambio en el centro de gravedad.  Tropezones con un objeto que no puede ver.  Falta de rigidez Nurse, adult) de los ligamentos, lo que provoca movimientos menos coordinados (puede sentirse torpe).  [employer] se realizan actividades de alto riesgo, como equitacin o esqu.  Accidentes automovilsticos. Es importante usar Merchandiser, retail cinturn de seguridad, con el cinturn del regazo por debajo del abdomen, y conducir siempre con cuidado.  Violencia o agresin domstica.  Quemaduras (por fuego o electricidad). Las causas ms frecuentes de lesiones o muerte de las embarazadas incluyen lo siguiente:  Lesiones que causan una hemorragia grave, shock y la prdida de la irrigacin  sangunea a los principales rganos.  Lesiones en la cabeza o el cuello que causan lesiones cerebrales o espinales graves.  Traumatismos en el trax que pueden causar una lesin directa en el corazn y los pulmones, o cualquier lesin que afecta la zona de las Anna Freeman. Los traumatismos en estas zonas pueden causar un paro cardiorrespiratorio. QU PUEDO HACER PARA PROTEGERME Y PROTEGER A MI BEB DE LAS LESIONES MIENTRAS ESTOY Anna Freeman?  Quite las alfombrillas con las que puede resbalarse y los objetos sueltos en el piso que aumentan el riesgo de tropezones.  No camine sobre pisos mojados o resbalosos.  Use un calzado cmodo con suela de buena adherencia. No use zapatos con tacones altos.  Use siempre el cinturn de seguridad del modo correcto, con el cinturn del regazo por debajo del abdomen, y [place] con cuidado. No ande en motocicleta mientras est embarazada.  No participe en actividades ni practique deportes de alto impacto.  Evite encender fuego, levantar recipientes pesados que contengan lquidos calientes, o reparar problemas elctricos.  Utilice los medicamentos de venta libre o recetados para Glass blower/designer, Health and safety inspector o la fiebre, segn se lo indique el mdico.  Conozca su tipo de [name] y el tipo de sangre del padre en caso de que tenga una hemorragia vaginal o sufra una lesin debido a la cual sea necesaria una transfusin de Anna Anna Freeman.  Comunquese con el servicio de emergencias de su localidad (911 en los [place]) si es vctima de violencia o agresin domstica. El abuso a Retail banker puede ser causa significativa de traumatismo durante el [name]. Para obtener ayuda y 80, pngase en contacto con  la Lnea directa nacional para la violencia domstica (National Domestic Violence Hotline). CUNDO DEBO BUSCAR ASISTENCIA MDICA INMEDIATA?   Se cae sobre el abdomen o sufre un accidente o una lesin muy fuertes.  Sufri una agresin (domstica o de otro  tipo).  Tuvo un accidente automovilstico.  Presenta una hemorragia vaginal abundante.  Tiene prdida de lquido por la vagina.  Siente contracciones uterinas (clicos plvicos, dolor o mucho dolor de espalda).  Se siente dbil, se desmaya o presenta vmitos persistentes luego de cualquier traumatismo.  Sufri una [place] grave, por ejemplo, [employer], el cuello, las manos o los genitales, o las quemaduras son ms grandes que el tamao de la palma de la mano en Financial risk analyst.  Tiene rigidez o dolor en el cuello despus de una cada o debido a otro traumatismo.  Siente dolor de [place] o tiene problemas visuales despus de una cada o algn otro traumatismo.  No siente que el beb se mueve o el beb no se mueve tanto como antes de sufrir una cada u otro traumatismo.   Esta informacin no tiene Theme park manager el consejo del mdico. Asegrese de hacerle al mdico cualquier pregunta que tenga.   Document Released: 02/03/2005 Document Revised: 02/24/2014 Elsevier Interactive Patient Education 2016 Anna Anna Freeman.    Evaluacin de los movimientos fetales  (Fetal Movement Counts) Nombre del paciente: __________________________________________________ Anna Anna Freeman estimada: ____________________ Anna Anna Freeman de los movimientos fetales es muy recomendable en los embarazos de alto riesgo, pero tambin es una buena idea que lo hagan todas las Anna Anna Freeman. El Firefighter que comience a contarlos a las 28 semanas de [place]. Los movimientos fetales suelen aumentar:   Despus de Animator.  Despus de la actividad fsica.  Despus de comer o beber [place] Anna Freeman o fro.  En reposo. Preste atencin cuando sienta que el beb est ms activo. Esto le ayudar a notar un patrn de ciclos de vigilia y sueo de su beb y cules son los factores que contribuyen a un aumento de los movimientos fetales. Es importante llevar a cabo un recuento de movimientos  fetales, al mismo tiempo cada da, cuando el beb normalmente est ms activo.  CMO CONTAR LOS MOVIMIENTOS FETALES 1. Busque un lugar tranquilo y cmodo para sentarse o recostarse sobre el lado izquierdo. Al recostarse sobre su lado izquierdo, le proporciona una mejor circulacin de [place] y oxgeno al beb. 2. Anote el da y la hora en una hoja de papel o en un diario. 3. Comience contando las pataditas, revoloteos, chasquidos, vueltas o pinchazos en un perodo de 2 horas. Debe sentir al menos 10 movimientos en 2 horas. 4. Si no siente 10 movimientos en 2 horas, espere 2  3 horas y cuente de nuevo. Busque cambios en el patrn o si no cuenta lo suficiente en 2 horas. SOLICITE ATENCIN MDICA SI:   Siente menos de 10 pataditas en 2 horas, en dos intentos.  No hay movimientos durante una hora.  El patrn se modifica o le lleva ms tiempo Art gallery manager las 10 pataditas.  Siente que el beb no se mueve como lo hace habitualmente. Fecha: ____________ Movimientos: ____________ Stevan Born inicio: ____________ Stevan Born finalizacin: ____________  Franco Nones: ____________ Movimientos: ____________ Stevan Born inicio: ____________ Stevan Born finalizacin: ____________  Franco Nones: ____________ Movimientos: ____________ Stevan Born inicio: ____________ Stevan Born finalizacin: ____________  Franco Nones: ____________ Movimientos: ____________ Stevan Born inicio: ____________ Stevan Born finalizacin: ____________  Franco Nones: ____________ Movimientos: ____________ Mammie Russian de inicio: ____________  Hora de finalizacin: ____________  Franco Nones: ____________ Movimientos: ____________ Stevan Born inicio: ____________ Stevan Born finalizacin: ____________  Franco Nones: ____________ Movimientos: ____________ Stevan Born inicio: ____________ Stevan Born finalizacin: ____________  Franco Nones: ____________ Movimientos: ____________ Stevan Born inicio: ____________ Stevan Born finalizacin: ____________  Franco Nones: ____________ Movimientos: ____________ Stevan Born inicio: ____________ Stevan Born  finalizacin: ____________  Franco Nones: ____________ Movimientos: ____________ Stevan Born inicio: ____________ Mammie Russian de finalizacin: ____________  Franco Nones: ____________ Movimientos: ____________ Mammie Russian de inicio: ____________ Mammie Russian de finalizacin: ____________  Franco Nones: ____________ Movimientos: ____________ Mammie Russian de inicio: ____________ Mammie Russian de finalizacin: ____________  Franco Nones: ____________ Movimientos: ____________ Mammie Russian de inicio: ____________ Mammie Russian de finalizacin: ____________  Franco Nones: ____________ Movimientos: ____________ Mammie Russian de inicio: ____________ Mammie Russian de finalizacin: ____________  Franco Nones: ____________ Movimientos: ____________ Mammie Russian de inicio: ____________ Mammie Russian de finalizacin: ____________  Franco Nones: ____________ Movimientos: ____________ Mammie Russian de inicio: ____________ Mammie Russian de finalizacin: ____________  Franco Nones: ____________ Movimientos: ____________ Mammie Russian de inicio: ____________ Mammie Russian de finalizacin: ____________  Franco Nones: ____________ Movimientos: ____________ Mammie Russian de inicio: ____________ Mammie Russian de finalizacin: ____________  Franco Nones: ____________ Movimientos: ____________ Mammie Russian de inicio: ____________ Mammie Russian de finalizacin: ____________  Franco Nones: ____________ Movimientos: ____________ Mammie Russian de inicio: ____________ Mammie Russian de finalizacin: ____________  Franco Nones: ____________ Movimientos: ____________ Mammie Russian de inicio: ____________ Mammie Russian de finalizacin: ____________  Franco Nones: ____________ Movimientos: ____________ Mammie Russian de inicio: ____________ Mammie Russian de finalizacin: ____________  Franco Nones: ____________ Movimientos: ____________ Mammie Russian de inicio: ____________ Mammie Russian de finalizacin: ____________  Franco Nones: ____________ Movimientos: ____________ Mammie Russian de inicio: ____________ Mammie Russian de finalizacin: ____________  Franco Nones: ____________ Movimientos: ____________ Mammie Russian de inicio: ____________ Mammie Russian de finalizacin: ____________  Franco Nones: ____________ Movimientos: ____________ Mammie Russian de inicio: ____________ Mammie Russian de finalizacin: ____________  Franco Nones:  ____________ Movimientos: ____________ Mammie Russian de inicio: ____________ Mammie Russian de finalizacin: ____________  Franco Nones: ____________ Movimientos: ____________ Mammie Russian de inicio: ____________ Mammie Russian de finalizacin: ____________  Franco Nones: ____________ Movimientos: ____________ Mammie Russian de inicio: ____________ Mammie Russian de finalizacin: ____________  Franco Nones: ____________ Movimientos: ____________ Mammie Russian de inicio: ____________ Mammie Russian de finalizacin: ____________  Franco Nones: ____________ Movimientos: ____________ Mammie Russian de inicio: ____________ Mammie Russian de finalizacin: ____________  Franco Nones: ____________ Movimientos: ____________ Mammie Russian de inicio: ____________ Mammie Russian de finalizacin: ____________  Franco Nones: ____________ Movimientos: ____________ Mammie Russian de inicio: ____________ Mammie Russian de finalizacin: ____________  Franco Nones: ____________ Movimientos: ____________ Mammie Russian de inicio: ____________ Mammie Russian de finalizacin: ____________  Franco Nones: ____________ Movimientos: ____________ Mammie Russian de inicio: ____________ Mammie Russian de finalizacin: ____________  Franco Nones: ____________ Movimientos: ____________ Mammie Russian de inicio: ____________ Mammie Russian de finalizacin: ____________  Franco Nones: ____________ Movimientos: ____________ Mammie Russian de inicio: ____________ Mammie Russian de finalizacin: ____________  Franco Nones: ____________ Movimientos: ____________ Mammie Russian de inicio: ____________ Mammie Russian de finalizacin: ____________  Franco Nones: ____________ Movimientos: ____________ Mammie Russian de inicio: ____________ Mammie Russian de finalizacin: ____________  Franco Nones: ____________ Movimientos: ____________ Mammie Russian de inicio: ____________ Mammie Russian de finalizacin: ____________  Franco Nones: ____________ Movimientos: ____________ Mammie Russian de inicio: ____________ Mammie Russian de finalizacin: ____________  Franco Nones: ____________ Movimientos: ____________ Mammie Russian de inicio: ____________ Mammie Russian de finalizacin: ____________  Franco Nones: ____________ Movimientos: ____________ Mammie Russian de inicio: ____________ Mammie Russian de finalizacin: ____________  Franco Nones: ____________ Movimientos: ____________ Mammie Russian  de inicio: ____________ Mammie Russian de finalizacin: ____________  Franco Nones: ____________ Movimientos: ____________ Mammie Russian de inicio: ____________ Mammie Russian de finalizacin: ____________  Franco Nones: ____________ Movimientos: ____________ Mammie Russian de inicio: ____________ Mammie Russian de finalizacin: ____________  Franco Nones: ____________ Movimientos: ____________ Mammie Russian de inicio: ____________ Mammie Russian de finalizacin: ____________  Franco Nones: ____________ Movimientos: ____________ Mammie Russian de inicio: ____________ Mammie Russian de finalizacin: ____________  Franco Nones: ____________ Movimientos: ____________ Mammie Russian de inicio: ____________ Mammie Russian de finalizacin: ____________  Franco Nones: ____________ Movimientos: ____________ Mammie Russian de inicio: ____________ Mammie Russian de finalizacin: ____________  Franco Nones: ____________ Movimientos: ____________ Stevan Born inicio:  ____________ Stevan BornHora de finalizacin: ____________  Franco NonesFecha: ____________ Movimientos: ____________ Stevan BornHora de inicio: ____________ Stevan BornHora de finalizacin: ____________  Franco NonesFecha: ____________ Movimientos: ____________ Stevan BornHora de inicio: ____________ Stevan BornHora de finalizacin: ____________  Franco NonesFecha: ____________ Movimientos: ____________ Stevan BornHora de inicio: ____________ Stevan BornHora de finalizacin: ____________  Franco NonesFecha: ____________ Movimientos: ____________ Stevan BornHora de inicio: ____________ Stevan BornHora de finalizacin: ____________  Franco NonesFecha: ____________ Movimientos: ____________ Stevan BornHora de inicio: ____________ Mammie RussianHora de finalizacin: ____________    Esta informacin no tiene como fin reemplazar el consejo del mdico. Asegrese de hacerle al mdico cualquier pregunta que tenga.   Document Released: 05/13/2007 Document Revised: 01/21/2012 Elsevier Interactive Patient Education [employer]2016 Elsevier Inc.

## 2015-10-25 NOTE — MAU Provider Note (Signed)
  History     CSN: [id number]  Arrival date and time: 10/25/15 1420   First Provider Initiated Contact with Patient 10/25/15 1517      Chief Complaint  Patient presents with  . Fall   HPI Anna Freeman is a 20 y.o. G3P1011 at [id number] who presents s/p fall. Incident occurred last night at 10 pm. Was giving her other child a bath when she fell & hit her abdomen against the side of the bath tub. Denies any other injury or LOC. Denies abdominal pain, contractions, vaginal bleeding, or LOF. Positive fetal movement. Call [facility] today & was told to come to MAU.  Spanish interpreter at bedside for interview.    OB History    Gravida Para Term Preterm AB Living   3 1 1  0 1 1   SAB TAB Ectopic Multiple Live Births   1 0 0 0 1      Past Medical History:  Diagnosis Date  . Medical history non-contributory     Past Surgical History:  Procedure Laterality Date  . NO PAST SURGERIES      Family History  Problem Relation Age of Onset  . Heart disease Sister     Social History  Substance Use Topics  . Smoking status: Never Smoker  . Smokeless tobacco: Never Used  . Alcohol use No    Allergies: No Known Allergies  Prescriptions Prior to Admission  Medication Sig Dispense Refill Last Dose  . Prenatal Vit-Fe Fumarate-FA (PRENATAL MULTIVITAMIN) TABS tablet Take 1 tablet by mouth daily at 12 noon.    07/30/2015 at Unknown time    Review of Systems  Gastrointestinal: Negative for abdominal pain.  Genitourinary: Negative.   Musculoskeletal: Positive for falls.   Physical Exam   Blood pressure 116/70, pulse 101, temperature 98.2 F (36.8 C), resp. rate 18, height 4\' 10"  (1.473 m), weight 134 lb 14.4 oz (61.2 kg), last menstrual period 02/17/2015.  Physical Exam  Nursing note and vitals reviewed. Constitutional: She is oriented to person, place, and time. She appears well-developed and well-nourished. No distress.  HENT:  Head: Normocephalic and atraumatic.  Eyes:  Conjunctivae are normal. Right eye exhibits no discharge. Left eye exhibits no discharge. No scleral icterus.  Neck: Normal range of motion.  Respiratory: Effort normal. No respiratory distress.  GI: There is no tenderness.  Neurological: She is alert and oriented to person, place, and time.  Skin: Skin is warm and dry. She is not diaphoretic.  Psychiatric: She has a normal mood and affect. Her behavior is normal. Judgment and thought content normal.   Fetal Tracing:  Baseline: 140 Variability: moderate Accelerations: 15x15 Decelerations: none  Toco: none ---- some UI towards end of tracing  Dilation: Closed Effacement (%): Thick Exam by:: Estanislado SpireE. Ryeleigh Santore NP  MAU Course  Procedures  MDM Reactive fetal tracing; no contractions; no external signs of injury Abdomen soft & non tender Ultrasound ordered to assess placenta -- normal ultrasound, anterior placenta SVE closed S/w Dr. Alysia PennaErvin; ok to discharge home Assessment and Plan  A: 1. [redacted] weeks gestation of pregnancy   2. Abdominal trauma     P: Discharge home Discussed reasons to return to MAU Keep f/u with OB  Judeth HornErin Santiago Graf 10/25/2015, 3:13 PM

## 2015-11-26 LAB — OB RESULTS CONSOLE GC/CHLAMYDIA
Chlamydia: NEGATIVE
Gonorrhea: NEGATIVE

## 2015-11-26 LAB — OB RESULTS CONSOLE GBS: GBS: NEGATIVE

## 2015-11-28 ENCOUNTER — Inpatient Hospital Stay (HOSPITAL_COMMUNITY)
Admission: AD | Admit: 2015-11-28 | Discharge: 2015-11-28 | Disposition: A | Discharge status: Home / Self Care | Payer: Self-pay | Source: Ambulatory Visit | Attending: Obstetrics and Gynecology | Admitting: Obstetrics and Gynecology

## 2015-11-28 ENCOUNTER — Encounter (HOSPITAL_COMMUNITY): Payer: Self-pay

## 2015-11-28 DIAGNOSIS — O4703 False labor before 37 completed weeks of gestation, third trimester: Secondary | ICD-10-CM

## 2015-11-28 DIAGNOSIS — O479 False labor, unspecified: Secondary | ICD-10-CM

## 2015-11-28 DIAGNOSIS — Z3A35 35 weeks gestation of pregnancy: Secondary | ICD-10-CM | POA: Insufficient documentation

## 2015-11-28 DIAGNOSIS — Z3493 Encounter for supervision of normal pregnancy, unspecified, third trimester: Secondary | ICD-10-CM

## 2015-11-28 LAB — URINALYSIS, ROUTINE W REFLEX MICROSCOPIC
BILIRUBIN URINE: NEGATIVE
GLUCOSE, UA: NEGATIVE mg/dL
Ketones, ur: 15 mg/dL — AB
Nitrite: NEGATIVE
PH: 6 (ref 5.0–8.0)
Protein, ur: NEGATIVE mg/dL
SPECIFIC GRAVITY, URINE: 1.015 (ref 1.005–1.030)

## 2015-11-28 LAB — URINE MICROSCOPIC-ADD ON

## 2015-11-28 NOTE — Discharge Instructions (Signed)
Información sobre el parto prematuro  °(Preterm Labor Information) ° Se llama parto prematuro cuando se inicia antes de las 37 semanas de embarazo. La duración de un embarazo normal es de 39 a 41 semanas.  °CAUSAS  °Generalmente las causas del parto prematuro no se conocen. La causa más frecuente conocida es una infección.  °FACTORES DE RIESGO  °· Historia previa de parto prematuro. °· Romper la bolsa de aguas antes de tiempo. °· La placenta cubre la abertura del cuello. °· La placenta se despega del útero. °· El cuello es demasiado débil para contener al bebé en el útero. °· Hay mucho líquido en el saco amniótico. °· Consumo de drogas o hábito de fumar durante el embarazo. °· No aumentar de peso lo suficiente durante el embarazo. °· Mujeres menores de 18 años o mayores de 35 años. °· Tener bajos ingresos. °· Pertenecer a la raza afroamericana. °SÍNTOMAS  °· Cólicos similares a los menstruales, dolor en el vientre (abdominal) o dolor en la espalda. °· Contracciones regulares, tan frecuentes como seis en una hora. Pueden ser suaves o dolorosas. °· Contracciones que comienzan en la parte superior del vientre. Luego bajan hacia la zona inferior del vientre y hacia la espalda. °· Presión en la zona inferior del vientre que parece empeorar. °· Sangrado que proviene de la vagina. °· Pérdida de líquido por la vagina. °TRATAMIENTO  °El tratamiento depende de:  °· Su estado. °· El estado del bebé. °· Cuántas semanas tiene de embarazo. °El médico podrá indicarle:  °· Medicamentos para detener las contracciones. °· Que permanezca en la cama excepto para ir al baño (reposo en cama). °· Que permanezca en el hospital. °¿QUÉ DEBE HACER SI PIENSA QUE ESTÁ EN TRABAJO DE PARTO PREMATURO?  °Comuníquese con su médico de inmediato. Debe concurrir al hospital para ser controlada inmediatamente.  °¿CÓMO PUEDE EVITAR EL TRABAJO DE PARTO PREMATURO EN FUTUROS EMBARAZOS?  °· Si fuma, abandone el hábito. °· Mantenga un aumento de peso  saludable. °· Notome drogas ni manipule sustancias químicas que no necesita. °· Informe a su médico si piensa que tiene una infección. °· Informe a su médico si tuvo un trabajo de parto prematuro anteriormente. °  °Esta información no tiene como fin reemplazar el consejo del médico. Asegúrese de hacerle al médico cualquier pregunta que tenga. °  °Document Released: 03/08/2010 Document Revised: 10/06/2012 °Elsevier Interactive Patient Education ©2016 Elsevier Inc. ° °

## 2015-11-28 NOTE — MAU Provider Note (Signed)
Chief Complaint:  Contractions   First Provider Initiated Contact with Patient 11/28/15 1913     HPI: Anna Freeman is a 20 y.o. G3P1011 at 4335w3dwho presents to maternity admissions reporting feeling baby's head in vagina when she felt pressure at home Checked her vagina with her finger and felt head.  Was having intermittent mild contractions. Worried baby was coming out. She reports decreased fetal movement, denies LOF, vaginal bleeding, vaginal itching/burning, urinary symptoms, h/a, dizziness, n/v, diarrhea, constipation or fever/chills.  She denies headache, visual changes or RUQ abdominal pain.  Pelvic Pain  The patient's primary symptoms include pelvic pain. The patient's pertinent negatives include no genital itching, genital lesions, vaginal bleeding or vaginal discharge. This is a new problem. The current episode started today. The problem occurs constantly. The problem has been unchanged. The pain is mild. The problem affects both sides. She is pregnant. Associated symptoms include abdominal pain and back pain. Pertinent negatives include no chills, constipation, diarrhea, dysuria, fever, headaches, nausea or vomiting. Nothing aggravates the symptoms. She has tried nothing for the symptoms. She uses nothing for contraception.   RN Note: Pt was taking a shower around lunchtime and she felt pressure in her vagina. She put her finger inside and felt the baby's head. She currently feels like she can't close her legs and feels like she needs to go to the bathroom for a bowel movement. Pt states she is nauseous and just doesn't feel right. Pt states this started yesterday. Pt states the baby hasn't been moving as much as normal today. Pt denies bleeding and leaking of fluid.  Past Medical History: Past Medical History:  Diagnosis Date  . Medical history non-contributory     Past obstetric history: OB History  Gravida Para Term Preterm AB Living  3 1 1  0 1 1  SAB TAB Ectopic  Multiple Live Births  1 0 0 0 1    # Outcome Date GA Lbr Len/2nd Weight Sex Delivery Anes PTL Lv  3 Current           2 Term      Vag-Spont   LIV  1 SAB               Past Surgical History: Past Surgical History:  Procedure Laterality Date  . NO PAST SURGERIES      Family History: Family History  Problem Relation Age of Onset  . Heart disease Sister     Social History: Social History  Substance Use Topics  . Smoking status: Never Smoker  . Smokeless tobacco: Never Used  . Alcohol use No    Allergies: No Known Allergies  Meds:  Prescriptions Prior to Admission  Medication Sig Dispense Refill Last Dose  . Prenatal Vit-Fe Fumarate-FA (PRENATAL MULTIVITAMIN) TABS tablet Take 1 tablet by mouth daily.    10/25/2015 at Unknown time    I have reviewed patient's Past Medical Hx, Surgical Hx, Family Hx, Social Hx, medications and allergies.   ROS:  Review of Systems  Constitutional: Negative for chills and fever.  Gastrointestinal: Positive for abdominal pain. Negative for constipation, diarrhea, nausea and vomiting.  Genitourinary: Positive for pelvic pain. Negative for dysuria and vaginal discharge.  Musculoskeletal: Positive for back pain.  Neurological: Negative for headaches.   Other systems negative  Physical Exam  Patient Vitals for the past 24 hrs:  BP Temp Temp src Pulse Resp SpO2 Height Weight  11/28/15 1840 - - - 92 - 96 % - -  11/28/15 1823 121/73  98.3 F (36.8 C) Oral 93 18 98 % 4\' 10"  (1.473 m) 63.5 kg (140 lb)   Constitutional: Well-developed, well-nourished female in no acute distress.  Cardiovascular: normal rate and rhythm Respiratory: normal effort, clear to auscultation bilaterally GI: Abd soft, non-tender, gravid appropriate for gestational age.   No rebound or guarding. MS: Extremities nontender, no edema, normal ROM Neurologic: Alert and oriented x 4.  GU: Neg CVAT.  Cervix: Dilation: 1 Effacement (%): 50 Station: -1, 0 Exam by:: m  Brittani Purdum,cnm  FHT:  Baseline 140 , moderate variability, accelerations present, no decelerations Contractions:  Irregular     Labs: Results for orders placed or performed during the hospital encounter of 11/28/15 (from the past 24 hour(s))  Urinalysis, Routine w reflex microscopic (not at [facility])     Status: Abnormal   Collection Time: 11/28/15  6:05 PM  Result Value Ref Range   Color, Urine YELLOW YELLOW   APPearance CLEAR CLEAR   Specific Gravity, Urine 1.015 1.005 - 1.030   pH 6.0 5.0 - 8.0   Glucose, UA NEGATIVE NEGATIVE mg/dL   Hgb urine dipstick TRACE (A) NEGATIVE   Bilirubin Urine NEGATIVE NEGATIVE   Ketones, ur 15 (A) NEGATIVE mg/dL   Protein, ur NEGATIVE NEGATIVE mg/dL   Nitrite NEGATIVE NEGATIVE   Leukocytes, UA MODERATE (A) NEGATIVE  Urine microscopic-add on     Status: Abnormal   Collection Time: 11/28/15  6:05 PM  Result Value Ref Range   Squamous Epithelial / LPF 6-30 (A) NONE SEEN   WBC, UA 6-30 0 - 5 WBC/hpf   RBC / HPF 0-5 0 - 5 RBC/hpf   Bacteria, UA FEW (A) NONE SEEN   --/--/B POS (04/09 0135)  Imaging:  No results found.  MAU Course/MDM: I have ordered labs and reviewed results. UA normal NST reviewed Checked cervix and repeat cervical exam an hour later with no change noted Reassured patient that vertex in pelvis is normal at this stage of pregnancy. Reviewed signs of labor and other warning signs. Reviewed normal fetal heart rate tracing  Assessment: SIUP at [redacted]w[redacted]d Irregular contractions Fetal head engaged in pelvis  Plan: Discharge home Labor precautions and fetal kick counts Follow up in Office for prenatal visits and recheck of cervix Encouraged to return here or to other Urgent Care/ED if she develops worsening of symptoms, increase in pain, fever, or other concerning symptoms.       Pt stable at time of discharge.  Wynelle Bourgeois CNM, MSN Certified Nurse-Midwife 11/28/2015 7:26 PM

## 2015-11-28 NOTE — MAU Note (Signed)
Pt was taking a shower around lunchtime and she felt pressure in her vagina. She put her finger inside and felt the baby's head. She currently feels like she can't close her legs and feels like she needs to go to the bathroom for a bowel movement. Pt states she is nauseous and just doesn't feel right. Pt states this started yesterday. Pt states the baby hasn't been moving as much as normal today. Pt denies bleeding and leaking of fluid.

## 2015-12-28 ENCOUNTER — Encounter (HOSPITAL_COMMUNITY): Payer: Self-pay | Admitting: *Deleted

## 2015-12-28 ENCOUNTER — Inpatient Hospital Stay (HOSPITAL_COMMUNITY)
Admission: AD | Admit: 2015-12-28 | Discharge: 2015-12-31 | DRG: 775 | Disposition: A | Discharge status: Home / Self Care | Payer: Medicaid Other | Source: Ambulatory Visit | Attending: Obstetrics & Gynecology | Admitting: Obstetrics & Gynecology

## 2015-12-28 DIAGNOSIS — O479 False labor, unspecified: Secondary | ICD-10-CM | POA: Diagnosis present

## 2015-12-28 DIAGNOSIS — Z3A39 39 weeks gestation of pregnancy: Secondary | ICD-10-CM

## 2015-12-28 DIAGNOSIS — Z8249 Family history of ischemic heart disease and other diseases of the circulatory system: Secondary | ICD-10-CM | POA: Diagnosis not present

## 2015-12-28 DIAGNOSIS — Z3403 Encounter for supervision of normal first pregnancy, third trimester: Secondary | ICD-10-CM | POA: Diagnosis present

## 2015-12-28 LAB — CBC
HCT: 40.9 % (ref 36.0–46.0)
Hemoglobin: 14.4 g/dL (ref 12.0–15.0)
MCH: 31 pg (ref 26.0–34.0)
MCHC: 35.2 g/dL (ref 30.0–36.0)
MCV: 88 fL (ref 78.0–100.0)
PLATELETS: 211 10*3/uL (ref 150–400)
RBC: 4.65 MIL/uL (ref 3.87–5.11)
RDW: 13.5 % (ref 11.5–15.5)
WBC: 14.6 10*3/uL — AB (ref 4.0–10.5)

## 2015-12-28 MED ORDER — LACTATED RINGERS IV SOLN: 500.0000 mL | INTRAVENOUS | Status: DC | PRN

## 2015-12-28 MED ORDER — FENTANYL CITRATE (PF) 100 MCG/2ML IJ SOLN
50.0000 ug | INTRAMUSCULAR | Status: DC | PRN
  Administered 2015-12-29: 100 ug via INTRAVENOUS
  Filled 2015-12-28: qty 2

## 2015-12-28 MED ORDER — FLEET ENEMA 7-19 GM/118ML RE ENEM: 1.0000 | ENEMA | RECTAL | Status: DC | PRN

## 2015-12-28 MED ORDER — OXYTOCIN 40 UNITS IN LACTATED RINGERS INFUSION - SIMPLE MED
2.5000 [IU]/h | INTRAVENOUS | Status: DC
  Filled 2015-12-28: qty 1000

## 2015-12-28 MED ORDER — SOD CITRATE-CITRIC ACID 500-334 MG/5ML PO SOLN: 30.0000 mL | ORAL | Status: DC | PRN

## 2015-12-28 MED ORDER — LIDOCAINE HCL (PF) 1 % IJ SOLN
30.0000 mL | INTRAMUSCULAR | Status: DC | PRN
  Filled 2015-12-28: qty 30

## 2015-12-28 MED ORDER — ACETAMINOPHEN 325 MG PO TABS: 650.0000 mg | ORAL_TABLET | ORAL | Status: DC | PRN

## 2015-12-28 MED ORDER — OXYCODONE-ACETAMINOPHEN 5-325 MG PO TABS: 1.0000 | ORAL_TABLET | ORAL | Status: DC | PRN

## 2015-12-28 MED ORDER — LACTATED RINGERS IV SOLN
INTRAVENOUS | Status: DC
  Administered 2015-12-29: 01:00:00 via INTRAVENOUS

## 2015-12-28 MED ORDER — ONDANSETRON HCL 4 MG/2ML IJ SOLN: 4.0000 mg | Freq: Four times a day (QID) | INTRAMUSCULAR | Status: DC | PRN

## 2015-12-28 MED ORDER — OXYTOCIN BOLUS FROM INFUSION
500.0000 mL | Freq: Once | INTRAVENOUS | Status: AC
  Administered 2015-12-29: 500 mL via INTRAVENOUS

## 2015-12-28 MED ORDER — OXYCODONE-ACETAMINOPHEN 5-325 MG PO TABS: 2.0000 | ORAL_TABLET | ORAL | Status: DC | PRN

## 2015-12-28 MED ORDER — FENTANYL CITRATE (PF) 100 MCG/2ML IJ SOLN
50.0000 ug | INTRAMUSCULAR | Status: DC | PRN
Start: 2015-12-28 — End: 2015-12-29

## 2015-12-28 NOTE — MAU Note (Signed)
Urine sent to lab 

## 2015-12-28 NOTE — MAU Note (Addendum)
Contractions since 1700. Yellow vag d/c for 3 days. No bleeding. 1cm 2wks ago. Baby has not moved as much today

## 2015-12-28 NOTE — H&P (Signed)
LABOR AND DELIVERY ADMISSION HISTORY AND PHYSICAL__Student Note  Anna Freeman is a 20 y.o. female G3P1011 with IUP at 3863w5d by US presenting for SOL.   She reports positive fetal movement. She denies leakage of fluid or vaginal bleeding.  Prenatal History/Complications:  Past Medical History: Past Medical History:  Diagnosis Date  . Medical history non-contributory     Past Surgical History: Past Surgical History:  Procedure Laterality Date  . NO PAST SURGERIES      Obstetrical History: OB History    Gravida Para Term Preterm AB Living   3 1 1  0 1 1   SAB TAB Ectopic Multiple Live Births   1 0 0 0 1      Social History: Social History   Social History  . Marital status: Single    Spouse name: N/A  . Number of children: N/A  . Years of education: N/A   Social History Main Topics  . Smoking status: Never Smoker  . Smokeless tobacco: Never Used  . Alcohol use No  . Drug use: No  . Sexual activity: Yes   Other Topics Concern  . None   Social History Narrative  . None    Family History: Family History  Problem Relation Age of Onset  . Heart disease Sister     Allergies: No Known Allergies  Prescriptions Prior to Admission  Medication Sig Dispense Refill Last Dose  . Prenatal Vit-Fe Fumarate-FA (PRENATAL MULTIVITAMIN) TABS tablet Take 1 tablet by mouth daily.    11/27/2015 at Unknown time     Review of Systems   All systems reviewed and negative except as stated in HPI  Blood pressure 126/82, pulse 86, temperature 97.8 F (36.6 C), resp. rate 18, height 4\' 9"  (1.448 m), weight 64.3 kg (141 lb 12.8 oz), last menstrual period 02/17/2015. General appearance: alert, cooperative, appears stated age and no distress Lungs: clear to auscultation bilaterally Heart: regular rate and rhythm Abdomen: soft, non-tender; bowel sounds normal Extremities: No calf swelling or tenderness Presentation: cephalic Fetal monitoring: FHR 120 w/ mod  variability, (+) accels, no decels Uterine activity: Normal Dilation: 3 Effacement (%): 50 Station: -1 Exam by:: Artis FlockKatie Koontz,RNC   Prenatal labs: ABO, Rh: B/Positive/-- (04/20 0000) Antibody: Negative (04/20 0000) Rubella: !Error! Immune RPR: Nonreactive (04/20 0000)  HBsAg: Indeterminate (04/20 0000)  HIV: Non-reactive (04/20 0000)  GBS: Negative (10/09 0000)  1 hr Glucola: normal per Pt Genetic screening:  Normal Anatomy US: Girl  Prenatal Transfer Tool  Maternal Diabetes: No Genetic Screening: Declined Maternal Ultrasounds/Referrals: Normal Fetal Ultrasounds or other Referrals:  None Maternal Substance Abuse:  No Significant Maternal Medications:  None Significant Maternal Lab Results: Lab values include: Group B Strep negative  No results found for this or any previous visit (from the past 24 hour(s)).  Patient Active Problem List   Diagnosis Date Noted  . [redacted] weeks gestation of pregnancy   . Hereditary disease in family possibly affecting fetus     Assessment: Anna Freeman is a 20 y.o. G3P1011 at 4163w5d here for SOL at 5 pm this evening.   #labor: SOL at 5 pm, intact in MAU #Pain: IV pain meds, no epidural #FWB: Cat 1 #ID:  GBS negative #MOF: Breast #MOC: Nexplanon #Circ:  N/A  Sian E Lewis-Bevan 12/28/2015, 11:18 PM   Note by student--see my H&P

## 2015-12-29 ENCOUNTER — Encounter (HOSPITAL_COMMUNITY): Payer: Self-pay

## 2015-12-29 ENCOUNTER — Inpatient Hospital Stay (HOSPITAL_COMMUNITY): Payer: Medicaid Other | Admitting: Anesthesiology

## 2015-12-29 DIAGNOSIS — Z8249 Family history of ischemic heart disease and other diseases of the circulatory system: Secondary | ICD-10-CM

## 2015-12-29 DIAGNOSIS — Z3A39 39 weeks gestation of pregnancy: Secondary | ICD-10-CM

## 2015-12-29 LAB — TYPE AND SCREEN
ABO/RH(D): B POS
ANTIBODY SCREEN: NEGATIVE

## 2015-12-29 LAB — RPR: RPR: NONREACTIVE

## 2015-12-29 MED ORDER — ONDANSETRON HCL 4 MG PO TABS: 4.0000 mg | ORAL_TABLET | ORAL | Status: DC | PRN

## 2015-12-29 MED ORDER — ZOLPIDEM TARTRATE 5 MG PO TABS: 5.0000 mg | ORAL_TABLET | Freq: Every evening | ORAL | Status: DC | PRN

## 2015-12-29 MED ORDER — ACETAMINOPHEN 325 MG PO TABS
650.0000 mg | ORAL_TABLET | ORAL | Status: DC | PRN
  Administered 2015-12-29: 650 mg via ORAL
  Filled 2015-12-29: qty 2

## 2015-12-29 MED ORDER — MEASLES, MUMPS & RUBELLA VAC ~~LOC~~ INJ
0.5000 mL | INJECTION | Freq: Once | SUBCUTANEOUS | Status: DC
  Filled 2015-12-29: qty 0.5

## 2015-12-29 MED ORDER — PHENYLEPHRINE 40 MCG/ML (10ML) SYRINGE FOR IV PUSH (FOR BLOOD PRESSURE SUPPORT)
80.0000 ug | PREFILLED_SYRINGE | INTRAVENOUS | Status: DC | PRN
  Filled 2015-12-29: qty 10

## 2015-12-29 MED ORDER — BENZOCAINE-MENTHOL 20-0.5 % EX AERO
1.0000 "application " | INHALATION_SPRAY | CUTANEOUS | Status: DC | PRN
  Filled 2015-12-29 (×2): qty 56

## 2015-12-29 MED ORDER — METHYLERGONOVINE MALEATE 0.2 MG PO TABS: 0.2000 mg | ORAL_TABLET | ORAL | Status: DC | PRN

## 2015-12-29 MED ORDER — LIDOCAINE HCL (PF) 1 % IJ SOLN
INTRAMUSCULAR | Status: DC | PRN
  Administered 2015-12-29: 5 mL via EPIDURAL
  Administered 2015-12-29: 3 mL

## 2015-12-29 MED ORDER — PRENATAL MULTIVITAMIN CH
1.0000 | ORAL_TABLET | Freq: Every day | ORAL | Status: DC
  Administered 2015-12-29 – 2015-12-31 (×3): 1 via ORAL
  Filled 2015-12-29 (×3): qty 1

## 2015-12-29 MED ORDER — COCONUT OIL OIL
1.0000 | TOPICAL_OIL | Status: DC | PRN
Start: 2015-12-29 — End: 2015-12-31
  Filled 2015-12-29: qty 120

## 2015-12-29 MED ORDER — EPHEDRINE 5 MG/ML INJ: 10.0000 mg | INTRAVENOUS | Status: DC | PRN

## 2015-12-29 MED ORDER — DIPHENHYDRAMINE HCL 25 MG PO CAPS: 25.0000 mg | ORAL_CAPSULE | Freq: Four times a day (QID) | ORAL | Status: DC | PRN

## 2015-12-29 MED ORDER — TETANUS-DIPHTH-ACELL PERTUSSIS 5-2.5-18.5 LF-MCG/0.5 IM SUSP: 0.5000 mL | Freq: Once | INTRAMUSCULAR | Status: DC

## 2015-12-29 MED ORDER — LACTATED RINGERS IV SOLN: 500.0000 mL | Freq: Once | INTRAVENOUS | Status: DC

## 2015-12-29 MED ORDER — BISACODYL 10 MG RE SUPP
10.0000 mg | Freq: Every day | RECTAL | Status: DC | PRN
  Filled 2015-12-29: qty 1

## 2015-12-29 MED ORDER — OXYCODONE HCL 5 MG PO TABS: 10.0000 mg | ORAL_TABLET | ORAL | Status: DC | PRN

## 2015-12-29 MED ORDER — DIPHENHYDRAMINE HCL 50 MG/ML IJ SOLN: 12.5000 mg | INTRAMUSCULAR | Status: DC | PRN

## 2015-12-29 MED ORDER — FLEET ENEMA 7-19 GM/118ML RE ENEM: 1.0000 | ENEMA | Freq: Every day | RECTAL | Status: DC | PRN

## 2015-12-29 MED ORDER — FERROUS SULFATE 325 (65 FE) MG PO TABS
325.0000 mg | ORAL_TABLET | Freq: Two times a day (BID) | ORAL | Status: DC
  Administered 2015-12-29 – 2015-12-31 (×5): 325 mg via ORAL
  Filled 2015-12-29 (×6): qty 1

## 2015-12-29 MED ORDER — WITCH HAZEL-GLYCERIN EX PADS: 1.0000 "application " | MEDICATED_PAD | CUTANEOUS | Status: DC | PRN

## 2015-12-29 MED ORDER — IBUPROFEN 600 MG PO TABS
600.0000 mg | ORAL_TABLET | Freq: Four times a day (QID) | ORAL | Status: DC
  Administered 2015-12-29 – 2015-12-31 (×9): 600 mg via ORAL
  Filled 2015-12-29 (×9): qty 1

## 2015-12-29 MED ORDER — ONDANSETRON HCL 4 MG/2ML IJ SOLN: 4.0000 mg | INTRAMUSCULAR | Status: DC | PRN

## 2015-12-29 MED ORDER — METHYLERGONOVINE MALEATE 0.2 MG/ML IJ SOLN: 0.2000 mg | INTRAMUSCULAR | Status: DC | PRN

## 2015-12-29 MED ORDER — FENTANYL 2.5 MCG/ML BUPIVACAINE 1/10 % EPIDURAL INFUSION (WH - ANES)
14.0000 mL/h | INTRAMUSCULAR | Status: DC | PRN
  Administered 2015-12-29: 12 mL/h via EPIDURAL
  Filled 2015-12-29: qty 100

## 2015-12-29 MED ORDER — SIMETHICONE 80 MG PO CHEW: 80.0000 mg | CHEWABLE_TABLET | ORAL | Status: DC | PRN

## 2015-12-29 MED ORDER — DIBUCAINE 1 % RE OINT
1.0000 "application " | TOPICAL_OINTMENT | RECTAL | Status: DC | PRN
  Filled 2015-12-29: qty 28

## 2015-12-29 MED ORDER — OXYCODONE HCL 5 MG PO TABS: 5.0000 mg | ORAL_TABLET | ORAL | Status: DC | PRN

## 2015-12-29 MED ORDER — DOCUSATE SODIUM 100 MG PO CAPS
100.0000 mg | ORAL_CAPSULE | Freq: Two times a day (BID) | ORAL | Status: DC
  Administered 2015-12-29 – 2015-12-31 (×4): 100 mg via ORAL
  Filled 2015-12-29 (×4): qty 1

## 2015-12-29 NOTE — Anesthesia Postprocedure Evaluation (Signed)
Anesthesia Post Note  Patient: Anna CookDelia Martinez Freeman  Procedure(s) Performed: * No procedures listed *  Patient location during evaluation: Mother Baby Anesthesia Type: Epidural Level of consciousness: awake and alert Pain management: pain level controlled Vital Signs Assessment: post-procedure vital signs reviewed and stable Respiratory status: spontaneous breathing and nonlabored ventilation Cardiovascular status: stable Postop Assessment: no headache, no backache, patient able to bend at knees, epidural receding, no signs of nausea or vomiting and adequate PO intake Anesthetic complications: no     Last Vitals:  Vitals:   12/29/15 0455 12/29/15 0944  BP: (!) 113/46 (!) 119/51  Pulse: (!) 110 (!) 110  Resp: 18 18  Temp: 37.2 C 37 C    Last Pain:  Vitals:   12/29/15 0952  TempSrc:   PainSc: 0-No pain   Pain Goal:                 [name],Anna Freeman

## 2015-12-29 NOTE — H&P (Signed)
  LABOR AND DELIVERY ADMISSION HISTORY AND PHYSICAL NOTE  Anna Freeman is a 20 y.o. female G3P1011 with IUP at 5032w5d by US presenting for contractions for several hours ,getting stronger  She reports positive fetal movement. She denies leakage of fluid or vaginal bleeding.  Prenatal History/Complications   Term SVD w/o problems  Past Medical History: Past Medical History:  Diagnosis Date  . Medical history non-contributory     Past Surgical History: Past Surgical History:  Procedure Laterality Date  . NO PAST SURGERIES      Obstetrical History: OB History    Gravida Para Term Preterm AB Living   3 1 1  0 1 1   SAB TAB Ectopic Multiple Live Births   1 0 0 0 1      Social History: Social History   Social History  . Marital status: Single    Spouse name: N/A  . Number of children: N/A  . Years of education: N/A   Social History Main Topics  . Smoking status: Never Smoker  . Smokeless tobacco: Never Used  . Alcohol use No  . Drug use: No  . Sexual activity: Yes   Other Topics Concern  . None   Social History Narrative  . None    Family History: Family History  Problem Relation Age of Onset  . Heart disease Sister     Allergies: No Known Allergies  Prescriptions Prior to Admission  Medication Sig Dispense Refill Last Dose  . Prenatal Vit-Fe Fumarate-FA (PRENATAL MULTIVITAMIN) TABS tablet Take 1 tablet by mouth daily.    11/27/2015 at Unknown time     Review of Systems   All systems reviewed and negative except as stated in HPI  Blood pressure 126/82, pulse 86, temperature 97.8 F (36.6 C), resp. rate 18, height 4\' 9"  (1.448 m), weight 64.3 kg (141 lb 12.8 oz), last menstrual period 02/17/2015. General appearance: alert, cooperative, appears stated age and no distress Lungs: clear to auscultation bilaterally Heart: regular rate and rhythm Abdomen: soft, non-tender; bowel sounds normal Extremities: No calf swelling or  tenderness Presentation: cephalic Fetal monitoring: FHR 120 w/ mod variability, (+) accels, no decels Uterine activity: Normal Dilation: 4-5 Effacement (%): 80 Station: -1 Exam by:: fran Cresenzo-Dishmon CNM   Prenatal labs: ABO, Rh: B/Positive/-- (04/20 0000) Antibody: Negative (04/20 0000) Rubella: !Error! Immune RPR: Nonreactive (04/20 0000)  HBsAg: Indeterminate (04/20 0000)  HIV: Non-reactive (04/20 0000)  GBS: Negative (10/09 0000)  1 hr Glucola: normal per Pt Genetic screening:  Normal Anatomy US: Girl  Prenatal Transfer Tool  Maternal Diabetes: No Genetic Screening: Declined Maternal Ultrasounds/Referrals: Normal Fetal Ultrasounds or other Referrals:  None Maternal Substance Abuse:  No Significant Maternal Medications:  None Significant Maternal Lab Results: Lab values include: Group B Strep negative  No results found for this or any previous visit (from the past 24 hour(s)).  Patient Active Problem List   Diagnosis Date Noted  . [redacted] weeks gestation of pregnancy   . Hereditary disease in family possibly affecting fetus     Assessment: Anna Freeman is a 20 y.o. G3P1011 at 39w5, early labor  #labor: expectant management #Pain: IV pain meds, no epidural #FWB: Cat 1 #ID:  GBS negative #MOF: Breast #MOC: Nexplanon #Circ:  N/A

## 2015-12-29 NOTE — Lactation Note (Signed)
This note was copied from a baby's chart. Lactation Consultation Note  Patient Name: Girl Hibba Kaylyn LimMartinez Ramirez Today's Date: 12/29/2015 Reason for consult: Initial assessment   Initial assessment with Exp BF mom of 14 hour old infant. [facility] with [facility], [place]. Infant with 4 BF for 10-15 minutes, 2 voids and 1 stool since birth. Infant weight 7 lb 4.2 oz. LATCH Scores 7-8 by bedside RN's. Infant currently asleep in mom's arms.   Mom denies questions/concerns at this time. Mom reports she is aware of how to hand express. Nipple care reviewed. Mom denies nipple pain or tenderness. Enc mom to feed infant 8-12 x in 24 hours at first feeding cues.   [facility] and is aware to call and make an OP appt after d/c. Bf Resources Handout and LC Brochure given, mom informed of IP/OP Services, BF support Groups and LC phone #. Enc mom to call out to desk for feeding assistance as needed. Follow up tomorrow and prn.     Maternal Data Formula Feeding for Exclusion: No Has patient been taught Hand Expression?: Yes Does the patient have breastfeeding experience prior to this delivery?: Yes  Feeding Feeding Type: Breast Fed Length of feed: 15 min  LATCH Score/Interventions Latch: Grasps breast easily, tongue down, lips flanged, rhythmical sucking.  Audible Swallowing: A few with stimulation Intervention(s): Hand expression (hand expression instruction given)  Type of Nipple: Everted at rest and after stimulation  Comfort (Breast/Nipple): Soft / non-tender     Hold (Positioning): Assistance needed to correctly position infant at breast and maintain latch. Intervention(s): Support Pillows;Position options  LATCH Score: 8  Lactation Tools Discussed/Used WIC Program: Yes   Consult Status Consult Status: Follow-up Date: 12/30/15 Follow-up type: In-patient    Silas FloodSharon S Azayla Polo 12/29/2015, 4:30 PM

## 2015-12-29 NOTE — Anesthesia Procedure Notes (Signed)
Epidural Patient location during procedure: OB  Preanesthetic Checklist Completed: patient identified, site marked, surgical consent, pre-op evaluation, timeout performed, IV checked, risks and benefits discussed and monitors and equipment checked  Epidural Patient position: sitting Prep: site prepped and draped and DuraPrep Patient monitoring: continuous pulse ox and blood pressure Approach: midline Location: L3-L4 Injection technique: LOR air  Needle:  Needle type: Tuohy  Needle gauge: 17 G Needle length: 9 cm and 9 Needle insertion depth: 4 cm Catheter type: closed end flexible Catheter size: 19 Gauge Catheter at skin depth: 10 cm Test dose: negative  Assessment Events: blood not aspirated, injection not painful, no injection resistance, negative IV test and no paresthesia  Additional Notes Dosing of Epidural:  1st dose, through catheter ............................................. Xylocaine 30 mg  2nd dose, through catheter, after waiting 3 minutes........Xylocaine 50 mg     As each dose occurred, patient was free of IV sx; and patient exhibited no evidence of SA injection.  Patient is more comfortable after epidural dosed. Please see RN's note for documentation of vital signs,and FHR which are stable.  Patient reminded not to try to ambulate with numb legs, and that an RN must be present the 1st time she attempts to get up.    

## 2015-12-29 NOTE — Anesthesia Preprocedure Evaluation (Signed)

## 2015-12-30 NOTE — Lactation Note (Addendum)
This note was copied from a baby's chart. Lactation Consultation Note  Patient Name: Anna Freeman [id number]'s Date: 12/30/2015 Reason for consult: Follow-up assessment;Other (Comment) (decreased urine output)   Follow up with mom of 38 hour old infant. Spoke with mother with [facility] Alejandra. Infant with 5 BF for 10-20 minutes, 7 BF attempts, 4 stools and 1 void (mom reports infant voided at 3 pm)  in the 24 hours preceding this assessment. Infant weight 6 lb 13 oz with 6% weight loss since birth. LATCH Scores 7-9 by bedside RN's.   Mom reports infant will go to breast and then pull herself off. Infant was crying and  Cueing to feed. Mom had her dressed head to toe and had her wrapped in a thick blanket. Mom latched her to the left breast. Infant was crying and frantic and took a few minutes to latch. Attempted to get infant to suckle on gloved finger to get her calmer and noted infant mouth is dry. Enc mom to put infant to breast at feeding cues. Enc her to place infant to breast STS and to awaken infant as needed throughout feeding. Enc mom to compress/massage breast with feeding. Infant noted to have increased swallows with breast compression. Enc mom to feed infant at least 15-20 minutes and offer infant second breast with each feeding.   Mom reports she does not feel fuller today. She reports some nipple tenderness with latch, enc her to hand express and apply EBM to nipples after each feeding. Follow up tomorrow and prn.    Maternal Data Formula Feeding for Exclusion: No Does the patient have breastfeeding experience prior to this delivery?: Yes  Feeding Feeding Type: Breast Fed Length of feed: 5 min  LATCH Score/Interventions Latch: Grasps breast easily, tongue down, lips flanged, rhythmical sucking. Intervention(s): Skin to skin;Teach feeding cues;Waking techniques Intervention(s): Adjust position;Assist with latch;Breast massage;Breast  compression  Audible Swallowing: A few with stimulation Intervention(s): Hand expression;Skin to skin;Alternate breast massage  Type of Nipple: Everted at rest and after stimulation  Comfort (Breast/Nipple): Filling, red/small blisters or bruises, mild/mod discomfort  Problem noted: Mild/Moderate discomfort Interventions (Mild/moderate discomfort):  (EBM to nipples post BF)  Hold (Positioning): Assistance needed to correctly position infant at breast and maintain latch. Intervention(s): Breastfeeding basics reviewed;Support Pillows;Position options;Skin to skin  LATCH Score: 7  Lactation Tools Discussed/Used     Consult Status Consult Status: Follow-up Date: 12/31/15 Follow-up type: In-patient    Silas FloodSharon S Emeree Mahler 12/30/2015, 4:25 PM

## 2015-12-30 NOTE — Progress Notes (Signed)
CSW met with MOB at bedside due to consult for [facility] @ [id number]. At the time of this writer's visit, MOB noted she has had [facility] throughout her whole pregnancy. After completing chart review, it was noted that [facility] was received earlier than 28weeks thus, she did not receive [place]. At this time, no other needs addressed or requested. Case closed to this CSW.   Tyrail Grandfield, MSW, LCSW-A Clinical Social Worker  [facility]  Office: [phone]

## 2015-12-30 NOTE — Progress Notes (Signed)
Post Partum Day 1 Subjective: no complaints, up ad lib, voiding, tolerating PO and + flatus  Objective: Blood pressure 107/70, pulse 73, temperature 98 F (36.7 C), temperature source Oral, resp. rate 18, height 4\' 9"  (1.448 m), weight 141 lb (64 kg), last menstrual period 02/17/2015, SpO2 98 %, unknown if currently breastfeeding.  Physical Exam:  General: alert, cooperative, appears stated age and no distress Lochia: appropriate Uterine Fundus: firm Incision: n/a DVT Evaluation: No evidence of DVT seen on physical exam.   Recent Labs  12/28/15 2330  HGB 14.4  HCT 40.9    Assessment/Plan: Plan for discharge tomorrow   LOS: 2 days   Anna Freeman 12/30/2015, 8:55 AM

## 2015-12-31 MED ORDER — IBUPROFEN 600 MG PO TABS: 600.0000 mg | ORAL_TABLET | Freq: Four times a day (QID) | ORAL | 0 refills | Status: DC

## 2015-12-31 NOTE — Lactation Note (Signed)
This note was copied from a baby's chart. Lactation Consultation Note Mom speaks spanish, FOB speaks some [place]. Mom laying w/baby in side lying position when entered rm. Explained consult for assistance in BF help latch w/less pain. Asked FOB if need to call interpreter. FOB stated no, he will let me know if he doesn't understand.  Encouraged mom to sit up in football position for deeper latch. Mom has short shaft nipples w/bilateral stripes. Rt. Nipple w/crack very tender. Comfort gels given. FOB done teach back to clarify understanding.  Mom wearing sports bra and top that she if pulling up over breast causing breast to feel full. Encouraged to pull down below breast not to cause pressure in breast.  Hand expression w/squirting of colostrum. Demonstrated on positioning in football and using props for moms hand for support to keep baby to breast closer for deep latch. Encouraged no blankets during BF. Heard swallows.  Baby is able to obtain a deep latch if positioned correctly close to mom. Explained side lying at this time isn't able to get close. Patient Name: Anna Freeman ZOXWR'UToday's Date: 12/31/2015 Reason for consult: Follow-up assessment;Infant weight loss   Maternal Data    Feeding Feeding Type: Breast Fed Length of feed: 15 min  LATCH Score/Interventions Latch: Grasps breast easily, tongue down, lips flanged, rhythmical sucking. Intervention(s): Skin to skin;Teach feeding cues;Waking techniques Intervention(s): Adjust position;Assist with latch;Breast massage;Breast compression  Audible Swallowing: Spontaneous and intermittent Intervention(s): Skin to skin;Hand expression Intervention(s): Alternate breast massage  Type of Nipple: Everted at rest and after stimulation  Comfort (Breast/Nipple): Filling, red/small blisters or bruises, mild/mod discomfort  Problem noted: Cracked, bleeding, blisters, bruises;Mild/Moderate discomfort Interventions   (Cracked/bleeding/bruising/blister): Expressed breast milk to nipple Interventions (Mild/moderate discomfort): Comfort gels;Hand massage;Hand expression  Hold (Positioning): No assistance needed to correctly position infant at breast. Intervention(s): Support Pillows;Position options;Skin to skin  LATCH Score: 9  Lactation Tools Discussed/Used Tools: Comfort gels Pump Review: Setup, frequency, and cleaning;Milk Storage Initiated by:: Peri JeffersonL. Stephaniemarie Stoffel RN IBCLC Date initiated:: 12/31/15   Consult Status Consult Status: Follow-up Date: 12/31/15 (in pm) Follow-up type: In-patient    Sruthi Maurer, Diamond NickelLAURA G 12/31/2015, 2:15 AM

## 2015-12-31 NOTE — Progress Notes (Signed)
DC Teaching done via interpreter. All questions answered.

## 2015-12-31 NOTE — Discharge Summary (Signed)
OB Discharge Summary  Patient Name: Anna CookDelia Anna Freeman DOB: 08/23/1995 MRN: [id number]  Date of admission: 12/28/2015 Delivering MD: Youlanda MightySANTOS, JOSUE D   Date of discharge: 12/31/2015  Admitting diagnosis: 39.5w ctx less that 1 min apart, pain in lwr stomach and back Intrauterine pregnancy: 3277w6d     Secondary diagnosis:Active Problems:   Uterine contractions during pregnancy  Additional problems:none     Discharge diagnosis: Term Pregnancy Delivered                                                                     Post partum procedures:n/a  Augmentation: none  Complications: None  Hospital course:  Onset of Labor With Vaginal Delivery     20 y.o. yo [id number] at 2877w6d was admitted in Active Labor on 12/28/2015. Patient had an uncomplicated labor course as follows:  Membrane Rupture Time/Date: 1:50 AM ,12/29/2015   Intrapartum Procedures: Episiotomy: None [1]                                         Lacerations:  1st degree [2];Perineal [11]  Patient had a delivery of a Viable infant. 12/29/2015  Information for the patient's newborn:  Anna Freeman, Anna Freeman [[id number]][[id number]]  Delivery Method: Vaginal, Spontaneous Delivery (Filed from Delivery Summary)    Pateint had an uncomplicated postpartum course.  She is ambulating, tolerating a regular diet, passing flatus, and urinating well. Patient is discharged home in stable condition on 12/31/15.    Physical exam Vitals:   12/29/15 1808 12/30/15 0537 12/30/15 1900 12/31/15 0626  BP: (!) 110/48 107/70 109/65 (!) 110/58  Pulse: (!) 105 73 81 83  Resp: 16 18 18 18   Temp: 98.3 F (36.8 C) 98 F (36.7 C) 98.7 F (37.1 C) 98 F (36.7 C)  TempSrc: Oral Oral Oral Oral  SpO2: 98%  99%   Weight:      Height:       General: alert, cooperative and no distress Lochia: appropriate Uterine Fundus: firm Incision: Healing well with no significant drainage DVT Evaluation: No evidence of DVT seen on physical  exam. Labs: Lab Results  Component Value Date   WBC 14.6 (H) 12/28/2015   HGB 14.4 12/28/2015   HCT 40.9 12/28/2015   MCV 88.0 12/28/2015   PLT 211 12/28/2015   CMP Latest Ref Rng & Units 05/27/2015  Glucose 65 - 99 mg/dL 85  BUN 6 - 20 mg/dL 6  Creatinine 1.300.44 - 8.651.00 mg/dL 7.840.54  Sodium 696135 - 295145 mmol/L 136  Potassium 3.5 - 5.1 mmol/L 3.6  Chloride 101 - 111 mmol/L 107  CO2 22 - 32 mmol/L 23  Calcium 8.9 - 10.3 mg/dL 9.4  Total Protein 6.5 - 8.1 g/dL 7.6  Total Bilirubin 0.3 - 1.2 mg/dL 0.5  Alkaline Phos 38 - 126 U/L 62  AST 15 - 41 U/L 22  ALT 14 - 54 U/L 20    Discharge instruction: per After Visit Summary and "Baby and Me Booklet".  After Visit Meds:    Medication List    TAKE these medications   ibuprofen 600 MG tablet Commonly known as:  ADVIL,MOTRIN Take  1 tablet (600 mg total) by mouth every 6 (six) hours.   prenatal multivitamin Tabs tablet Take 1 tablet by mouth daily.       Diet: routine diet  Activity: Advance as tolerated. Pelvic rest for 6 weeks.   Outpatient follow up:6 weeks Follow up Appt:No future appointments. Follow up visit: No Follow-up on file.  Postpartum contraception: Nexplanon  Newborn Data: Live born female  Birth Weight: 7 lb 4.2 oz (3295 g) APGAR: 8, 9  Baby Feeding: Bottle and Breast Disposition:home with mother   12/31/2015 Anna Freeman, CNM

## 2015-12-31 NOTE — Progress Notes (Signed)
UR chart review completed.  

## 2015-12-31 NOTE — Lactation Note (Signed)
This note was copied from a baby's chart. Lactation Consultation Note  Patient Name: Anna Freeman ZOXWR'UToday's Date: 12/31/2015 Reason for consult: Follow-up assessment    With this mom of a term baby, now 6055 hours old, and at 9% weight loss. Mom has nipple stripes on both nipples, with dry scabs. The baby appears to have somewhat short lingual frenulum, posterior but close to the tip of her tongue. I fitted mom with a 20 nipple shiled, and showed her how to apply, and latch the baby in cross cradle, and then switch to cradle. Mom's breasts are full, with a steady stream of milk. The bay latched well, and I advanced her passed the nipple. Some breast movement seen, and mom denies any discomfort with this latch. There was lots of milk seen in the shield after the feeding. I offered mom to make an o/p lactation consult, but mom did not want one at this time. I gave parents the information on how to make an appointment at their convenience. I had Spanish interpreter, Eda, come to interpret  teaching with parents on the use and care of the shiied, and hand pump/ I gave mom a manual hand pump, and encouraged mom to pump at this time, since baby was still showing hunger cues, after breastfeeding twice. Mom has easily expressed transitional milk, and is encouraged to pump to comfort throughout the day, and offer EBm as supplement to baby as often as she wants. Mom knows to call for questions/conerns.    Maternal Data    Feeding Feeding Type: Breast Fed Length of feed: 15 min  LATCH Score/Interventions Latch: Grasps breast easily, tongue down, lips flanged, rhythmical sucking. Intervention(s): Skin to skin;Teach feeding cues;Waking techniques Intervention(s): Adjust position;Assist with latch;Breast compression  Audible Swallowing: Spontaneous and intermittent  Type of Nipple: Everted at rest and after stimulation  Comfort (Breast/Nipple): Filling, red/small blisters or bruises, mild/mod  discomfort  Problem noted: Mild/Moderate discomfort (strip scabs on nipples, using nipple shiled with good suckles, non discomfort)  Hold (Positioning): Assistance needed to correctly position infant at breast and maintain latch. Intervention(s): Breastfeeding basics reviewed;Position options;Skin to skin  LATCH Score: 8  Lactation Tools Discussed/Used Tools: Nipple Shields Nipple shield size: 20   Consult Status Consult Status: Complete Follow-up type: Call as needed    Alfred LevinsLee, Judi Jaffe Anne 12/31/2015, 9:12 AM

## 2016-12-30 IMAGING — US US MFM FETAL NUCHAL TRANSLUCENCY
1 series · 15 of 21 positions shown · non-contrast
Comparison: none

[Series 1: us mfm fetal nuchal translucency · 15 of 21 slices shown]
[im 1/21]
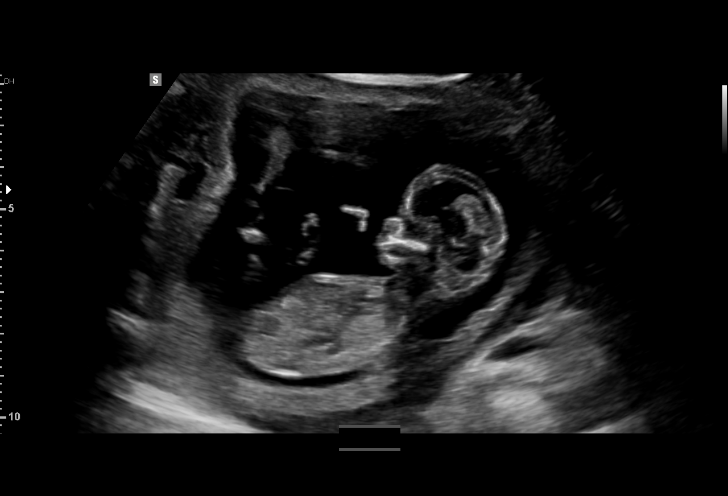
[im 3/21]
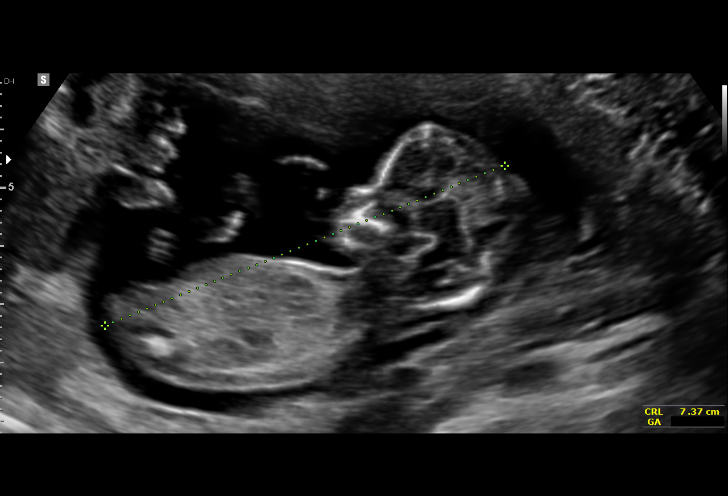
[im 4/21]
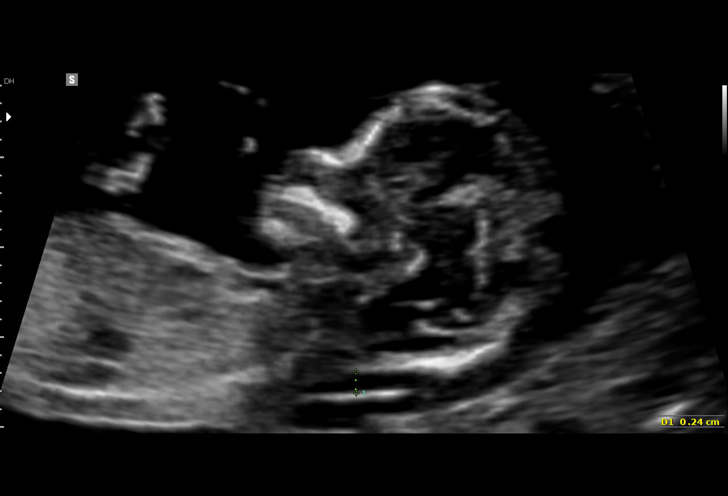
[im 5/21]
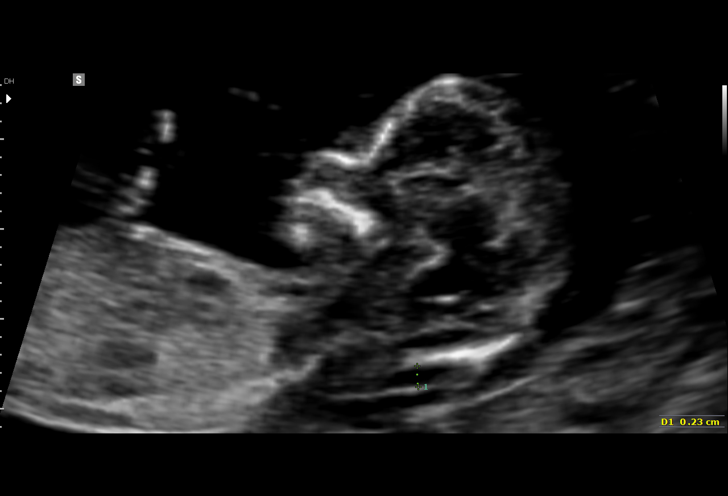
[im 7/21]
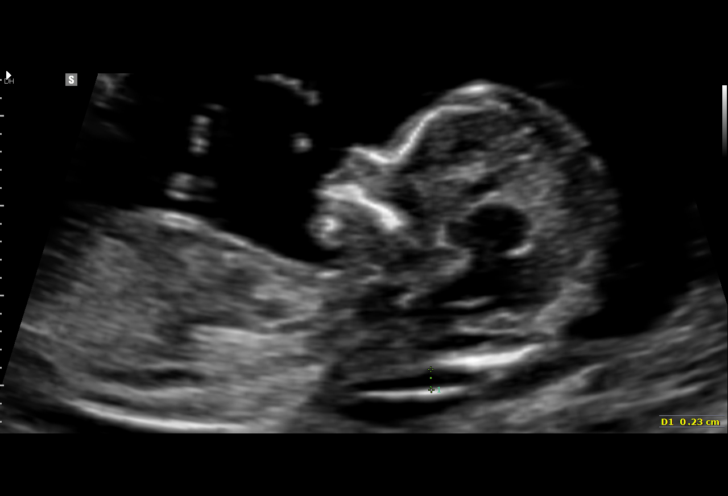
[im 8/21]
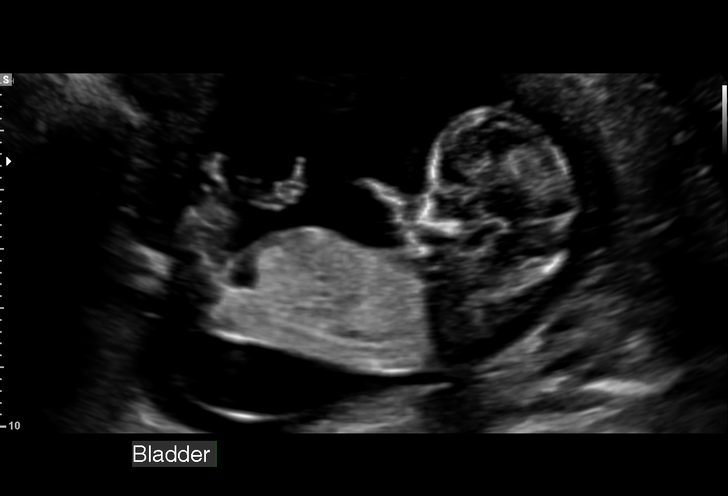
[im 10/21]
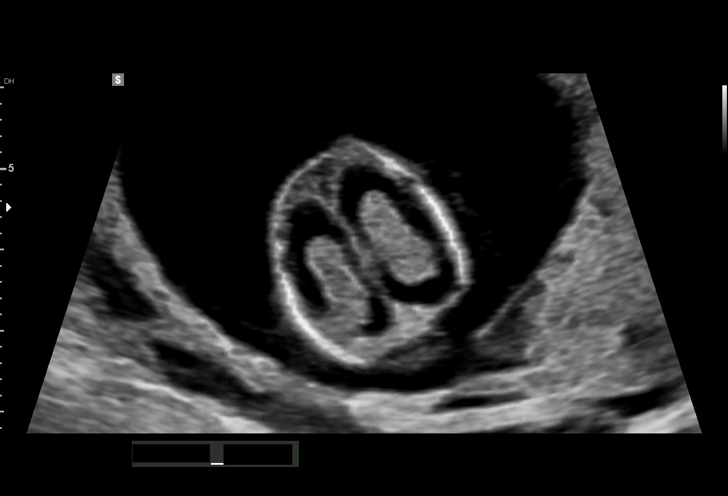
[im 11/21]
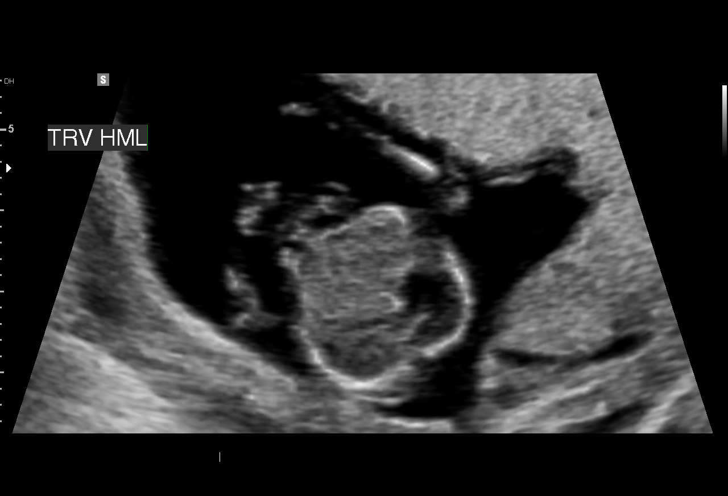
[im 12/21]
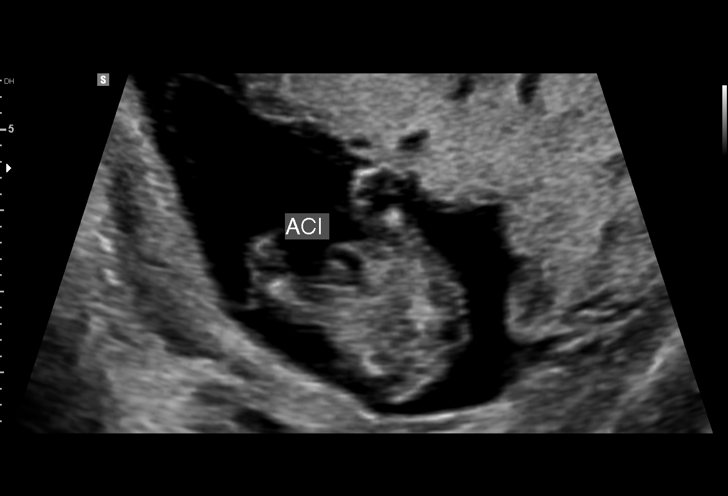
[im 14/21]
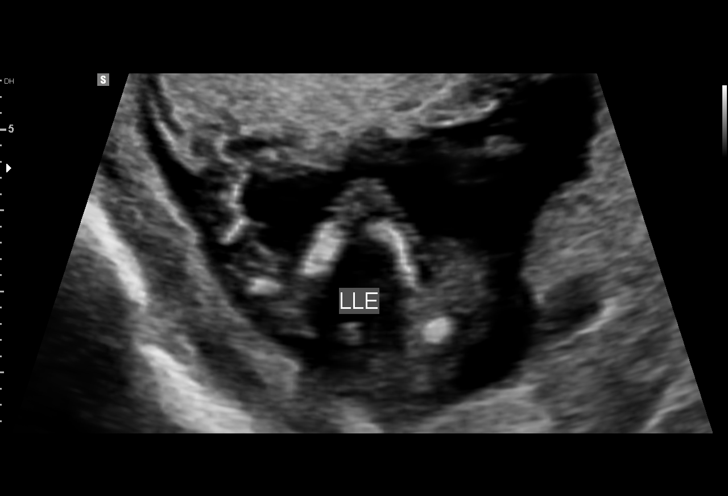
[im 15/21]
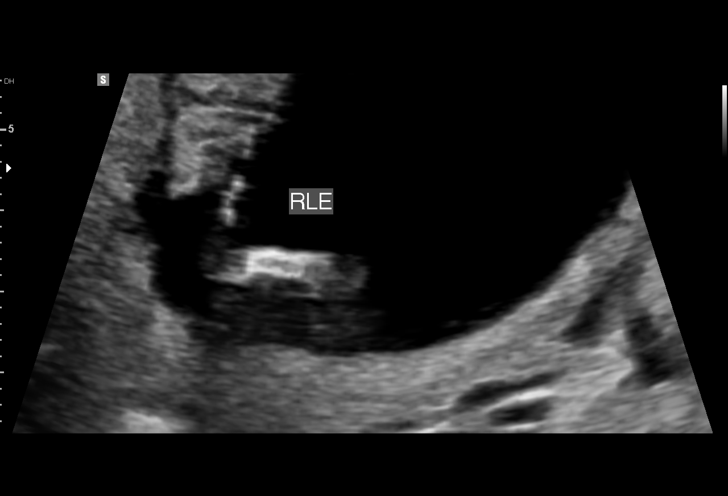
[im 17/21]
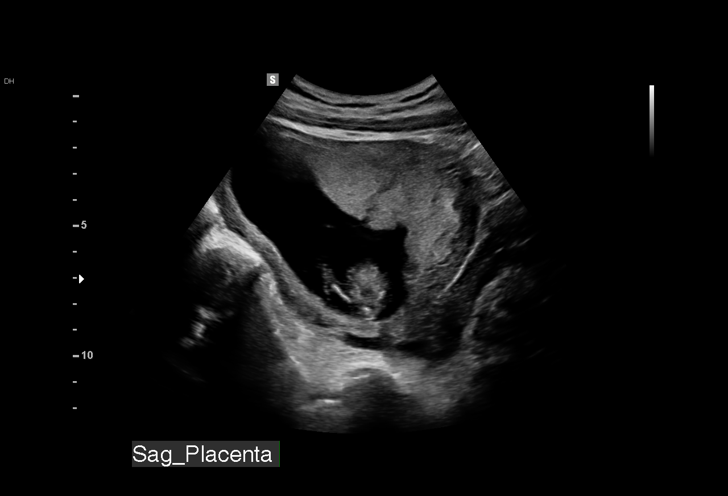
[im 18/21]
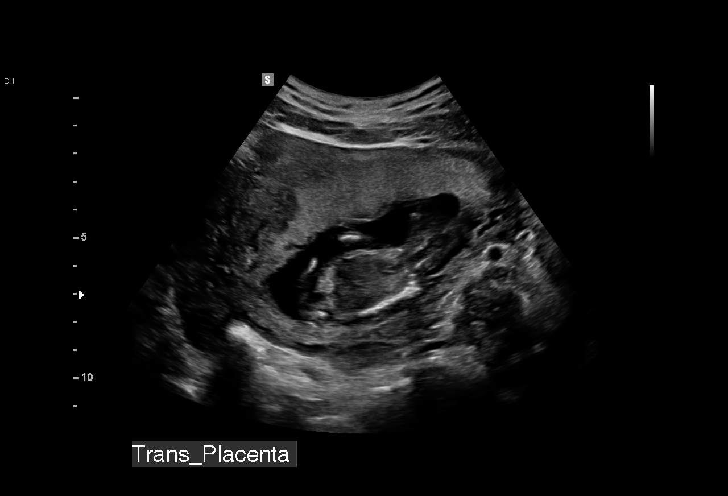
[im 19/21]
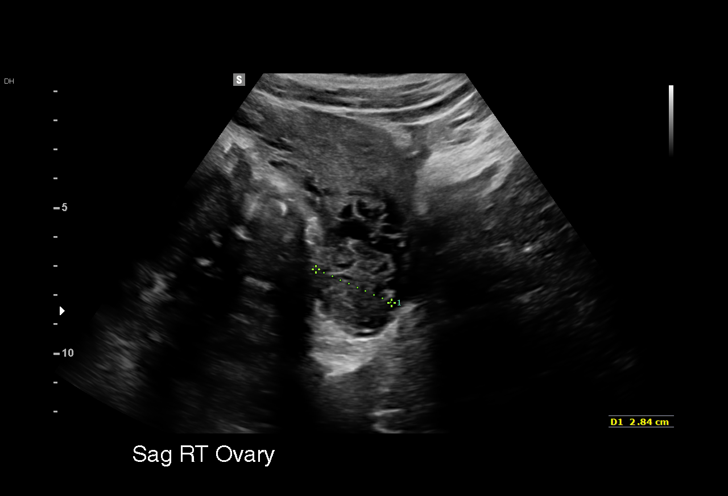
[im 21/21]
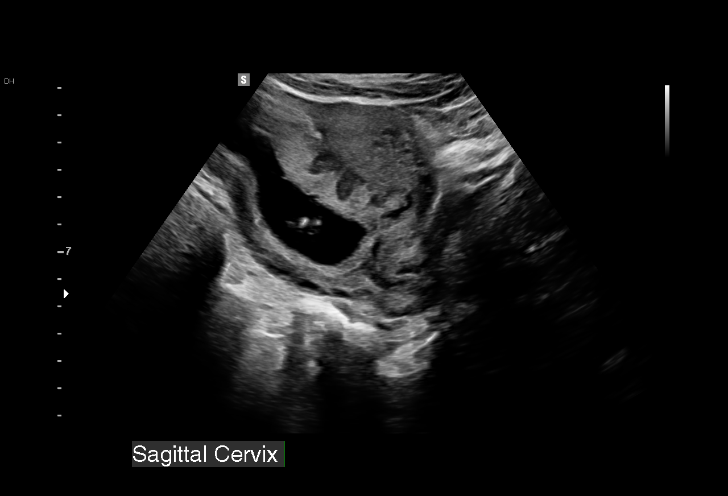

[15 of 21 positions shown; findings below may reference images not displayed]

[REDACTED]-
Faculty Physician

TRANSLUCENCY

1  NOMASIBULELE MOATSHE             [id number]      [id number]     [id number]
Indications

13 weeks gestation of pregnancy
First trimester aneuploidy screen (NT)         Z36
OB History

Gravidity:    2         Term:   1        Prem:   0        SAB:   0
TOP:          0       Ectopic:  0        Living: 1
Fetal Evaluation

Num Of Fetuses:     1
Fetal Heart         159
Rate(bpm):
Cardiac Activity:   Observed
Presentation:       Transverse, head to maternal left
Placenta:           Anterior

Amniotic Fluid
AFI FV:      Subjectively within normal limits
Gestational Age

Best:          13w 2d     Det. By:  Early Ultrasound         EDD:   12/30/15
(05/27/15)
1st Trimester Genetic Sonogram Screening

CRL:            74.1  mm    G. Age:   13w 2d                 EDD:   12/30/15
Nuc Trans:       2.4  mm
Nasal Bone:                 Present
Anatomy

Cranium:               Appears normal         Bladder:                Appears normal
Choroid Plexus:        Appears normal         Upper Extremities:      Visualized
Stomach:               Appears normal, left   Lower Extremities:      Visualized
sided
Abdominal Wall:        Appears nml (cord
insert, abd wall)
Cervix Uterus Adnexa

Cervix
Normal appearance by transabdominal scan.

Left Ovary
Within normal limits.

Right Ovary
Within normal limits.

Adnexa:       No abnormality visualized.
Impression

Single IUP at 13w 2d
Normal NT (2.4 mm)  A nasal bone was visualized.
First trimester aneuploidy screen performed as noted above.
Please do not draw triple/quad screen, though patient should
be offered MSAFP for neural tube defect screening.
Recommendations

Recommend ultrasound for fetal anatomy at 18-20 weeks

## 2017-01-10 ENCOUNTER — Emergency Department (HOSPITAL_COMMUNITY)
Admission: EM | Admit: 2017-01-10 | Discharge: 2017-01-10 | Disposition: A | Discharge status: Home / Self Care | Payer: Self-pay | Attending: Emergency Medicine | Admitting: Emergency Medicine

## 2017-01-10 ENCOUNTER — Other Ambulatory Visit: Payer: Self-pay

## 2017-01-10 ENCOUNTER — Encounter (HOSPITAL_COMMUNITY): Payer: Self-pay

## 2017-01-10 DIAGNOSIS — Y929 Unspecified place or not applicable: Secondary | ICD-10-CM | POA: Insufficient documentation

## 2017-01-10 DIAGNOSIS — Y939 Activity, unspecified: Secondary | ICD-10-CM | POA: Insufficient documentation

## 2017-01-10 DIAGNOSIS — S39012A Strain of muscle, fascia and tendon of lower back, initial encounter: Secondary | ICD-10-CM | POA: Insufficient documentation

## 2017-01-10 DIAGNOSIS — X58XXXA Exposure to other specified factors, initial encounter: Secondary | ICD-10-CM | POA: Insufficient documentation

## 2017-01-10 DIAGNOSIS — Z79899 Other long term (current) drug therapy: Secondary | ICD-10-CM | POA: Insufficient documentation

## 2017-01-10 DIAGNOSIS — T148XXA Other injury of unspecified body region, initial encounter: Secondary | ICD-10-CM

## 2017-01-10 DIAGNOSIS — Y999 Unspecified external cause status: Secondary | ICD-10-CM | POA: Insufficient documentation

## 2017-01-10 LAB — URINALYSIS, ROUTINE W REFLEX MICROSCOPIC
BILIRUBIN URINE: NEGATIVE
Glucose, UA: NEGATIVE mg/dL
Ketones, ur: NEGATIVE mg/dL
Leukocytes, UA: NEGATIVE
NITRITE: NEGATIVE
PH: 5 (ref 5.0–8.0)
Protein, ur: NEGATIVE mg/dL
SPECIFIC GRAVITY, URINE: 1.023 (ref 1.005–1.030)

## 2017-01-10 LAB — POC URINE PREG, ED: PREG TEST UR: NEGATIVE

## 2017-01-10 MED ORDER — IBUPROFEN 600 MG PO TABS: 600.0000 mg | ORAL_TABLET | Freq: Four times a day (QID) | ORAL | 0 refills | Status: DC | PRN

## 2017-01-10 NOTE — ED Notes (Signed)
Pt ambulatory and independent at discharge.  Verbalized understanding of discharge instructions 

## 2017-01-10 NOTE — ED Provider Notes (Signed)
[facility]EMERGENCY DEPT Provider Note  CSN: [id number] Arrival date & time: 01/10/17 1254  Chief Complaint(s) Flank Pain  HPI [name] is a 21 y.o. female   The history is provided by the patient.  Flank Pain  This is a new problem. Episode onset: 3 weeks. The problem occurs constantly. Progression since onset: fluctuates. Pertinent negatives include no chest pain, no abdominal pain, no headaches and no shortness of breath. The symptoms are aggravated by bending and twisting. Nothing relieves the symptoms. She has tried acetaminophen for the symptoms. The treatment provided mild relief.   Pain is described as aching with sporadic stabbing. Pain has improved following massage treatment.  Currently on her menstrual cycle.  No dysuria, freq. No vaginal discharge.   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   Past Medical History Past Medical History:  Diagnosis Date  . Medical history non-contributory    Patient Active Problem List   Diagnosis Date Noted  . Uterine contractions during pregnancy 12/28/2015  . [redacted] weeks gestation of pregnancy   . Hereditary disease in family possibly affecting fetus    Home Medication(s) Prior to Admission medications   Medication Sig Start Date End Date Taking? Authorizing Provider  ibuprofen (ADVIL,MOTRIN) 600 MG tablet Take 1 tablet (600 mg total) by mouth every 6 (six) hours as needed. 01/10/17   Nira Conn, MD  Prenatal Vit-Fe Fumarate-FA (PRENATAL MULTIVITAMIN) TABS tablet Take 1 tablet by mouth daily.     [provider]                                                                                                                                    Past Surgical History Past Surgical History:  Procedure Laterality Date  . NO PAST SURGERIES     Family History Family History  Problem Relation Age of Onset  . Heart disease  Sister     Social History Social History   Tobacco Use  . Smoking status: Never Smoker  . Smokeless tobacco: Never Used  Substance Use Topics  . Alcohol use: No  . Drug use: No   Allergies Patient has no known allergies.  Review of Systems Review of Systems  Respiratory: Negative for shortness of breath.   Cardiovascular: Negative for chest pain.  Gastrointestinal: Negative for abdominal pain.  Genitourinary: Positive for flank pain.  Neurological: Negative for headaches.   All other systems are reviewed and are negative for acute change except as noted in the HPI  Physical Exam Vital Signs  I have reviewed the triage vital signs BP 121/88 (BP Location: Left Arm)   Pulse 84   Temp 98.2 F (36.8 C) (Oral)   Resp 16   Ht 4\' 9"  (1.448 m)   Wt 61.2 kg (135 lb)   LMP 01/04/2017   SpO2 98%   BMI 29.21 kg/m  Physical Exam  Constitutional: She is oriented to person, place, and time. She appears well-developed and well-nourished. No distress.  HENT:  Head: Normocephalic and atraumatic.  Nose: Nose normal.  Eyes: Conjunctivae and EOM are normal. Pupils are equal, round, and reactive to light. Right eye exhibits no discharge. Left eye exhibits no discharge. No scleral icterus.  Neck: Normal range of motion. Neck supple.  Cardiovascular: Normal rate and regular rhythm. Exam reveals no gallop and no friction rub.  No murmur heard. Pulmonary/Chest: Effort normal and breath sounds normal. No stridor. No respiratory distress. She has no rales.  Abdominal: Soft. She exhibits no distension. There is no tenderness.  Musculoskeletal: She exhibits no edema.       Lumbar back: She exhibits tenderness and spasm. She exhibits no bony tenderness.       Back:  Neurological: She is alert and oriented to person, place, and time.  Spine Exam: Strength: 5/5 throughout LE bilaterally (hip flexion/extension, adduction/abduction; knee flexion/extension; foot dorsiflexion/plantarflexion,  inversion/eversion; great toe inversion) Sensation: Intact to light touch in proximal and distal LE bilaterally Reflexes: 1+ quadriceps and achilles reflexes   Skin: Skin is warm and dry. No rash noted. She is not diaphoretic. No erythema.  Psychiatric: She has a normal mood and affect.  Vitals reviewed.   ED Results and Treatments Labs (all labs ordered are listed, but only abnormal results are displayed) Labs Reviewed  URINALYSIS, ROUTINE W REFLEX MICROSCOPIC - Abnormal; Notable for the following components:      Result Value   Hgb urine dipstick MODERATE (*)    Bacteria, UA RARE (*)    Squamous Epithelial / LPF 0-5 (*)    All other components within normal limits  POC URINE PREG, ED                                                                                                                         EKG  EKG Interpretation  Date/Time:    Ventricular Rate:    PR Interval:    QRS Duration:   QT Interval:    QTC Calculation:   R Axis:     Text Interpretation:        Radiology No results found. Pertinent labs & imaging results that were available during my care of the patient were reviewed by me and considered in my medical decision making (see chart for details).  Medications Ordered in ED Medications - No data to display  Procedures Procedures EMERGENCY DEPARTMENT US RENAL EXAM  "Study: Limited Retroperitoneal Ultrasound of Kidneys"  INDICATIONS: Flank pain Long and short axis of both kidneys were obtained.   PERFORMED BY: Myself IMAGES ARCHIVED?: Yes LIMITATIONS: Body habitus VIEWS USED: Long axis and Short axis  INTERPRETATION: No Hydronephrosis, No Renal cyst, No Kidney stone    (including critical care time)  Medical Decision Making / ED Course I have reviewed the nursing notes for this encounter and the patient's prior  records (if available in EHR or on provided paperwork).    21 y.o. female presents with back pain in lumbar area for 3 weeks without signs of radicular pain. No acute traumatic onset. No red flag symptoms of fever, weight loss, saddle anesthesia, weakness, fecal/urinary incontinence or urinary retention.   Suspect MSK etiology. No indication for imaging emergently. Patient was recommended to take short course of scheduled NSAIDs and engage in early mobility as definitive treatment. Return precautions discussed for worsening or new concerning symptoms.   The patient is safe for discharge with strict return precautions.   Final Clinical Impression(s) / ED Diagnoses Final diagnoses:  Muscle strain   Disposition: Discharge  Condition: Good  I have discussed the results, Dx and Tx plan with the patient who expressed understanding and agree(s) with the plan. Discharge instructions discussed at great length. The patient was given strict return precautions who verbalized understanding of the instructions. No further questions at time of discharge.    ED Discharge Orders        Ordered    ibuprofen (ADVIL,MOTRIN) 600 MG tablet  Every 6 hours PRN     01/10/17 1454       Follow Up: Primary care provider   If you do not have a primary care physician, contact [facility] at [phone] for referral      This chart was dictated using voice recognition software.  Despite best efforts to proofread,  errors can occur which can change the documentation meaning.   Nira Connardama, Dela Sweeny Eduardo, MD 01/10/17 [phone]

## 2017-01-10 NOTE — ED Triage Notes (Addendum)
Pt c/o left flank pain x3 weeks and HA. Pt arrives today because pain increased over night and was unable to sleep. Denies urinary sx, N/V. Intermittent abd pain. Denies chest pain, shortness of breath. A/Ox4, ambulatory. [employer] services used

## 2017-01-10 NOTE — Discharge Instructions (Signed)
Puede tomar Motrin (Ibuprofen) o Aleve (Naproxen), Acetaminophen (Tylenol), crema para los musculos como SalonPas, Icy Hot, Bengay, etc. Puede estirar, ponerce hielo o comprecion de calor, o que le den masaje.  

## 2017-07-08 ENCOUNTER — Emergency Department (HOSPITAL_COMMUNITY)
Admission: EM | Admit: 2017-07-08 | Discharge: 2017-07-08 | Disposition: A | Discharge status: Home / Self Care | Payer: Self-pay | Attending: Emergency Medicine | Admitting: Emergency Medicine

## 2017-07-08 ENCOUNTER — Encounter (HOSPITAL_COMMUNITY): Payer: Self-pay | Admitting: Emergency Medicine

## 2017-07-08 DIAGNOSIS — R197 Diarrhea, unspecified: Secondary | ICD-10-CM

## 2017-07-08 DIAGNOSIS — Z79899 Other long term (current) drug therapy: Secondary | ICD-10-CM | POA: Insufficient documentation

## 2017-07-08 DIAGNOSIS — R1084 Generalized abdominal pain: Secondary | ICD-10-CM | POA: Insufficient documentation

## 2017-07-08 DIAGNOSIS — K921 Melena: Secondary | ICD-10-CM | POA: Insufficient documentation

## 2017-07-08 LAB — URINALYSIS, ROUTINE W REFLEX MICROSCOPIC
BILIRUBIN URINE: NEGATIVE
GLUCOSE, UA: NEGATIVE mg/dL
KETONES UR: NEGATIVE mg/dL
Leukocytes, UA: NEGATIVE
Nitrite: NEGATIVE
PROTEIN: NEGATIVE mg/dL
Specific Gravity, Urine: 1.026 (ref 1.005–1.030)
pH: 6 (ref 5.0–8.0)

## 2017-07-08 LAB — COMPREHENSIVE METABOLIC PANEL
ALT: 30 U/L (ref 14–54)
ANION GAP: 9 (ref 5–15)
AST: 23 U/L (ref 15–41)
Albumin: 3.9 g/dL (ref 3.5–5.0)
Alkaline Phosphatase: 100 U/L (ref 38–126)
BUN: 13 mg/dL (ref 6–20)
CHLORIDE: 110 mmol/L (ref 101–111)
CO2: 20 mmol/L — AB (ref 22–32)
Calcium: 9.3 mg/dL (ref 8.9–10.3)
Creatinine, Ser: 0.67 mg/dL (ref 0.44–1.00)
GFR calc Af Amer: >60 mL/min (ref >60)
GFR calc non Af Amer: >60 mL/min (ref >60)
Glucose, Bld: 97 mg/dL (ref 65–99)
Potassium: 4 mmol/L (ref 3.5–5.1)
SODIUM: 139 mmol/L (ref 135–145)
Total Bilirubin: 0.6 mg/dL (ref 0.3–1.2)
Total Protein: 7.3 g/dL (ref 6.5–8.1)

## 2017-07-08 LAB — TYPE AND SCREEN
ABO/RH(D): B POS
Antibody Screen: NEGATIVE

## 2017-07-08 LAB — LIPASE, BLOOD: Lipase: 45 U/L (ref 11–51)

## 2017-07-08 LAB — ABO/RH: ABO/RH(D): B POS

## 2017-07-08 LAB — CBC
HCT: 42.8 % (ref 36.0–46.0)
HEMOGLOBIN: 14.3 g/dL (ref 12.0–15.0)
MCH: 29.5 pg (ref 26.0–34.0)
MCHC: 33.4 g/dL (ref 30.0–36.0)
MCV: 88.4 fL (ref 78.0–100.0)
Platelets: 258 10*3/uL (ref 150–400)
RBC: 4.84 MIL/uL (ref 3.87–5.11)
RDW: 11.9 % (ref 11.5–15.5)
WBC: 12 10*3/uL — ABNORMAL HIGH (ref 4.0–10.5)

## 2017-07-08 LAB — I-STAT BETA HCG BLOOD, ED (MC, WL, AP ONLY): I-stat hCG, quantitative: <5 m[IU]/mL (ref <5)

## 2017-07-08 LAB — POC OCCULT BLOOD, ED: Fecal Occult Bld: POSITIVE — AB

## 2017-07-08 MED ORDER — ONDANSETRON 4 MG PO TBDP: 4.0000 mg | ORAL_TABLET | Freq: Three times a day (TID) | ORAL | 0 refills | Status: DC | PRN

## 2017-07-08 MED ORDER — ONDANSETRON 4 MG PO TBDP
4.0000 mg | ORAL_TABLET | Freq: Once | ORAL | Status: AC | PRN
  Administered 2017-07-08: 4 mg via ORAL
  Filled 2017-07-08: qty 1

## 2017-07-08 MED ORDER — OXYCODONE-ACETAMINOPHEN 5-325 MG PO TABS
1.0000 | ORAL_TABLET | ORAL | Status: DC | PRN
  Administered 2017-07-08: 1 via ORAL
  Filled 2017-07-08: qty 1

## 2017-07-08 NOTE — ED Provider Notes (Signed)
[facility] EMERGENCY DEPARTMENT Provider Note   CSN: [id number] Arrival date & time: 07/08/17  7829     History   Chief Complaint Chief Complaint  Patient presents with  . Abdominal Pain  . Rectal Bleeding    HPI Anna Freeman is a 22 y.o. female presenting to the ED with complaint of abdominal pain in center of abdomen since 4am. Has been having intermittent abdominal pains for about 6 days now, not assoc w meals.  Pain improved without intervention around 6 AM this morning, and resolved after oxycodone given in triage.  Also reports nausea and bloody diarrhea. No hx of hemorrhoids. Has not taken any medications at home for her symptoms. Reports ocassional EtOH use, last use Saturday.  Not on anticoagulation.  Patient is currently breast-feeding in the evenings.  No history of abdominal surgery.  Denies urinary symptoms, vaginal bleeding or discharge, vomiting, diarrhea, fever or chills. Denies recent out of country travel, drinking for questionable water sources, or recent abx.   The history is provided by the patient. The history is limited by a language barrier. A language interpreter was used.    Past Medical History:  Diagnosis Date  . Medical history non-contributory     Patient Active Problem List   Diagnosis Date Noted  . Uterine contractions during pregnancy 12/28/2015  . [redacted] weeks gestation of pregnancy   . Hereditary disease in family possibly affecting fetus     Past Surgical History:  Procedure Laterality Date  . NO PAST SURGERIES       OB History    Gravida  3   Para  2   Term  2   Preterm  0   AB  1   Living  2     SAB  1   TAB  0   Ectopic  0   Multiple  0   Live Births  2            Home Medications    Prior to Admission medications   Medication Sig Start Date End Date Taking? Authorizing Provider  ibuprofen (ADVIL,MOTRIN) 600 MG tablet Take 1 tablet (600 mg total) by mouth every 6 (six) hours as  needed. 01/10/17   Nira Conn, MD  ondansetron (ZOFRAN ODT) 4 MG disintegrating tablet Take 1 tablet (4 mg total) by mouth every 8 (eight) hours as needed for nausea or vomiting. 07/08/17   Robinson, [place] N, PA-C  Prenatal Vit-Fe Fumarate-FA (PRENATAL MULTIVITAMIN) TABS tablet Take 1 tablet by mouth daily.     [provider]    Family History Family History  Problem Relation Age of Onset  . Heart disease Sister     Social History Social History   Tobacco Use  . Smoking status: Never Smoker  . Smokeless tobacco: Never Used  Substance Use Topics  . Alcohol use: No  . Drug use: No     Allergies   Patient has no known allergies.   Review of Systems Review of Systems  Constitutional: Negative for appetite change, chills and fever.  Gastrointestinal: Positive for abdominal pain, blood in stool, diarrhea and nausea. Negative for constipation and vomiting.  Genitourinary: Negative for dysuria, frequency, vaginal bleeding and vaginal discharge.  All other systems reviewed and are negative.    Physical Exam Updated Vital Signs BP (!) 99/59   Pulse 84   Temp 98.2 F (36.8 C) (Oral)   Resp 16   SpO2 96%   Physical Exam  Constitutional: She appears well-developed and well-nourished. She does not appear ill. No distress.  HENT:  Head: Normocephalic and atraumatic.  Mouth/Throat: Oropharynx is clear and moist.  Eyes: Conjunctivae are normal.  Cardiovascular: Normal rate, regular rhythm and intact distal pulses.  Pulmonary/Chest: Effort normal and breath sounds normal.  Abdominal: Soft. Normal appearance and bowel sounds are normal. She exhibits no distension and no mass. There is generalized tenderness. There is no rigidity, no rebound and no guarding. No hernia.  Neurological: She is alert.  Skin: Skin is warm.  Psychiatric: She has a normal mood and affect. Her behavior is normal.  Nursing note and vitals reviewed.    ED Treatments / Results    Labs (all labs ordered are listed, but only abnormal results are displayed) Labs Reviewed  COMPREHENSIVE METABOLIC PANEL - Abnormal; Notable for the following components:      Result Value   CO2 20 (*)    All other components within normal limits  CBC - Abnormal; Notable for the following components:   WBC 12.0 (*)    All other components within normal limits  URINALYSIS, ROUTINE W REFLEX MICROSCOPIC - Abnormal; Notable for the following components:   APPearance HAZY (*)    Hgb urine dipstick SMALL (*)    Bacteria, UA RARE (*)    All other components within normal limits  POC OCCULT BLOOD, ED - Abnormal; Notable for the following components:   Fecal Occult Bld POSITIVE (*)    All other components within normal limits  LIPASE, BLOOD  I-STAT BETA HCG BLOOD, ED ([facility], [facility], [facility] ONLY)  TYPE AND SCREEN  ABO/RH    EKG None  Radiology No results found.  Procedures Procedures (including critical care time)  Medications Ordered in ED Medications  oxyCODONE-acetaminophen (PERCOCET/ROXICET) 5-325 MG per tablet 1 tablet (1 tablet Oral Given 07/08/17 0909)  ondansetron (ZOFRAN-ODT) disintegrating tablet 4 mg (4 mg Oral Given 07/08/17 0906)     Initial Impression / Assessment and Plan / ED Course  I have reviewed the triage vital signs and the nursing notes.  Pertinent labs & imaging results that were available during my care of the patient were reviewed by me and considered in my medical decision making (see chart for details).     Pt w generalized abdominal pain, nausea and episodes of bloody diarrhea. Suspect possible infectious diarrhea. No pelvic complaints. Patient is nontoxic, nonseptic appearing, in no apparent distress.  Patient's pain and other symptoms adequately managed in emergency department w PO medications prior to evaluation.  Labs with mild leukocytosis, normal hgb. CMP and lipase wnl. U/A and urine preg neg. Patient does not meet the SIRS or Sepsis criteria.    Did have  discussion with patient regarding CT scan to rule out acute intraabdominal pathology, though pt declined. On repeat exam patient does not have a surgical abdomen and there are no peritoneal signs. Low suspicion for acute appendicitis, bowel obstruction or perforation. Pt discussed with Dr. Silverio Lay. Pt is currently breastfeeding. Patient will be discharged home with symptomatic treatment and given strict instructions for follow-up with their primary care physician.  Pt safe for discharge.  Discussed results, findings, treatment and follow up. Patient advised of return precautions. Patient verbalized understanding and agreed with plan.  Final Clinical Impressions(s) / ED Diagnoses   Final diagnoses:  Generalized abdominal pain  Bloody diarrhea    ED Discharge Orders        Ordered    ondansetron (ZOFRAN ODT) 4 MG disintegrating tablet  Every 8 hours PRN     07/08/17 1402       Robinson, [place] N, PA-C 07/08/17 1410    Charlynne Pander, MD 07/08/17 2026

## 2017-07-08 NOTE — ED Notes (Signed)
Occult card at bedside.  Pt states pain is gone.

## 2017-07-08 NOTE — Discharge Instructions (Signed)
Please read instructions below. Drink clear liquids until your stomach feels better. Then, slowly introduce bland foods into your diet as tolerated, such as bread, rice, apples, bananas. You can take zofran every 8 hours as needed for nausea. Follow up with your primary care to follow up on your visit today. Return to the ER for severe abdominal pain, fever, uncontrollable vomiting, or new or concerning symptoms.  Por favor, lea las instrucciones a continuacin. Beba lquidos claros hasta que su estmago se sienta mejor. Luego, introduzca lentamente alimentos blandos en su dieta segn lo tolere, como el pan, el arroz, las Loraine, los pltanos. Puede tomar zofran cada 8 horas segn sea necesario para las nuseas. Puede tomar tylenol segn sea necesario para el dolor. Haga un seguimiento con su atencin primaria para dar seguimiento a su visita hoy. Regrese a la sala de emergencias para el dolor abdominal intenso, fiebre, vmitos incontrolables o sntomas nuevos o relacionados.

## 2017-07-08 NOTE — ED Triage Notes (Addendum)
Pt arrives via [facility] for c.o. 6 days of abdominal pain with nausea and bright red blood in stools. Pt states the pain is to center of abdomen. Pt speaks spanish. Pt currently breastfeeding but only at night. Still wants pain meds

## 2017-07-08 NOTE — ED Notes (Signed)
Pt stable, ambulatory, states understanding of discharge instructions 

## 2017-08-08 ENCOUNTER — Encounter (HOSPITAL_COMMUNITY): Payer: Self-pay | Admitting: Emergency Medicine

## 2017-08-08 ENCOUNTER — Emergency Department (HOSPITAL_COMMUNITY)
Admission: EM | Admit: 2017-08-08 | Discharge: 2017-08-08 | Disposition: A | Discharge status: Home / Self Care | Payer: Self-pay | Attending: Emergency Medicine | Admitting: Emergency Medicine

## 2017-08-08 DIAGNOSIS — J020 Streptococcal pharyngitis: Secondary | ICD-10-CM | POA: Insufficient documentation

## 2017-08-08 DIAGNOSIS — Z79899 Other long term (current) drug therapy: Secondary | ICD-10-CM | POA: Insufficient documentation

## 2017-08-08 MED ORDER — DEXAMETHASONE 4 MG PO TABS
10.0000 mg | ORAL_TABLET | Freq: Once | ORAL | Status: AC
  Administered 2017-08-08: 10 mg via ORAL
  Filled 2017-08-08: qty 3

## 2017-08-08 MED ORDER — AMOXICILLIN 500 MG PO CAPS: 1000.0000 mg | ORAL_CAPSULE | Freq: Two times a day (BID) | ORAL | 0 refills | Status: DC

## 2017-08-08 MED ORDER — AMOXICILLIN 500 MG PO CAPS
1000.0000 mg | ORAL_CAPSULE | Freq: Once | ORAL | Status: AC
  Administered 2017-08-08: 1000 mg via ORAL
  Filled 2017-08-08: qty 2

## 2017-08-08 NOTE — ED Triage Notes (Addendum)
Pt reports sore throat and fever since Thursday after using some clippers to scrape off a white patch that she saw on her tonsils. denies any other symptoms. Pt reports taking tylenol. Pt denies any sick contacts. Resp e/u, nad. Tonsils red and swollen.

## 2017-08-08 NOTE — ED Provider Notes (Signed)
[facility] EMERGENCY DEPARTMENT Provider Note   CSN: [id number] Arrival date & time: 08/08/17  4098     History   Chief Complaint Chief Complaint  Patient presents with  . Sore Throat  . Fever    HPI Anna Freeman is a 22 y.o. female.  22 yo F with a chief complaint of sore throat and fever.  Going on for the past couple days.  Denies coughing unsure of sick contacts.  Able to swallow the painful swallowing.  The history is provided by the patient and a relative. The history is limited by a language barrier. A language interpreter was used.  Sore Throat  This is a new problem. The current episode started 2 days ago. The problem occurs constantly. The problem has not changed since onset.Pertinent negatives include no chest pain, no headaches and no shortness of breath. Nothing aggravates the symptoms. Nothing relieves the symptoms. She has tried nothing for the symptoms.  Fever   Associated symptoms include sore throat. Pertinent negatives include no chest pain, no vomiting, no congestion and no headaches.    Past Medical History:  Diagnosis Date  . Medical history non-contributory     Patient Active Problem List   Diagnosis Date Noted  . Uterine contractions during pregnancy 12/28/2015  . [redacted] weeks gestation of pregnancy   . Hereditary disease in family possibly affecting fetus     Past Surgical History:  Procedure Laterality Date  . NO PAST SURGERIES       OB History    Gravida  3   Para  2   Term  2   Preterm  0   AB  1   Living  2     SAB  1   TAB  0   Ectopic  0   Multiple  0   Live Births  2            Home Medications    Prior to Admission medications   Medication Sig Start Date End Date Taking? Authorizing Provider  amoxicillin (AMOXIL) 500 MG capsule Take 2 capsules (1,000 mg total) by mouth 2 (two) times daily. 08/08/17   Melene Plan, DO  ibuprofen (ADVIL,MOTRIN) 600 MG tablet Take 1 tablet (600 mg  total) by mouth every 6 (six) hours as needed. 01/10/17   Nira Conn, MD  ondansetron (ZOFRAN ODT) 4 MG disintegrating tablet Take 1 tablet (4 mg total) by mouth every 8 (eight) hours as needed for nausea or vomiting. 07/08/17   Robinson, [place] N, PA-C  Prenatal Vit-Fe Fumarate-FA (PRENATAL MULTIVITAMIN) TABS tablet Take 1 tablet by mouth daily.     [provider]    Family History Family History  Problem Relation Age of Onset  . Heart disease Sister     Social History Social History   Tobacco Use  . Smoking status: Never Smoker  . Smokeless tobacco: Never Used  Substance Use Topics  . Alcohol use: No  . Drug use: No     Allergies   Patient has no known allergies.   Review of Systems Review of Systems  Constitutional: Positive for fever. Negative for chills.  HENT: Positive for sore throat. Negative for congestion and rhinorrhea.   Eyes: Negative for redness and visual disturbance.  Respiratory: Negative for shortness of breath and wheezing.   Cardiovascular: Negative for chest pain and palpitations.  Gastrointestinal: Negative for nausea and vomiting.  Genitourinary: Negative for dysuria and urgency.  Musculoskeletal: Negative for arthralgias  and myalgias.  Skin: Negative for pallor and wound.  Neurological: Negative for dizziness and headaches.     Physical Exam Updated Vital Signs BP 116/85   Pulse (!) 107   Temp 100.2 F (37.9 C)   Resp 18   LMP 01/04/2017   SpO2 100%   Physical Exam  Constitutional: She is oriented to person, place, and time. She appears well-developed and well-nourished. No distress.  HENT:  Head: Normocephalic and atraumatic.  Right Ear: Tympanic membrane normal.  Left Ear: Tympanic membrane normal.  Mild tonsillar swelling bilaterally left tonsil with purulent patches.  Left anterior cervical lymphadenopathy.  Tolerating secretions that difficulty.  Full range of motion of the neck.  Eyes: Pupils are equal,  round, and reactive to light. EOM are normal.  Neck: Normal range of motion. Neck supple.  Cardiovascular: Normal rate and regular rhythm. Exam reveals no gallop and no friction rub.  No murmur heard. Pulmonary/Chest: Effort normal. She has no wheezes. She has no rales.  Abdominal: Soft. She exhibits no distension. There is no tenderness.  Musculoskeletal: She exhibits no edema or tenderness.  Neurological: She is alert and oriented to person, place, and time.  Skin: Skin is warm and dry. She is not diaphoretic.  Psychiatric: She has a normal mood and affect. Her behavior is normal.  Nursing note and vitals reviewed.    ED Treatments / Results  Labs (all labs ordered are listed, but only abnormal results are displayed) Labs Reviewed - No data to display  EKG None  Radiology No results found.  Procedures Procedures (including critical care time)  Medications Ordered in ED Medications  amoxicillin (AMOXIL) capsule 1,000 mg (1,000 mg Oral Given 08/08/17 0555)  dexamethasone (DECADRON) tablet 10 mg (10 mg Oral Given 08/08/17 0556)     Initial Impression / Assessment and Plan / ED Course  I have reviewed the triage vital signs and the nursing notes.  Pertinent labs & imaging results that were available during my care of the patient were reviewed by me and considered in my medical decision making (see chart for details).     22 yo F with a chief complaint of a sore throat.  Patient has 4 out of 4 centor criteria.  Will treat with antibiotics.  Given Decadron.  Discharge home.  5:58 AM:  I have discussed the diagnosis/risks/treatment options with the patient and family and believe the pt to be eligible for discharge home to follow-up with PCP. We also discussed returning to the ED immediately if new or worsening sx occur. We discussed the sx which are most concerning (e.g., sudden worsening pain, fever, inability to tolerate by mouth) that necessitate immediate return. Medications  administered to the patient during their visit and any new prescriptions provided to the patient are listed below.  Medications given during this visit Medications  amoxicillin (AMOXIL) capsule 1,000 mg (1,000 mg Oral Given 08/08/17 0555)  dexamethasone (DECADRON) tablet 10 mg (10 mg Oral Given 08/08/17 0556)     The patient appears reasonably screen and/or stabilized for discharge and I doubt any other medical condition or other [facility] requiring further screening, evaluation, or treatment in the ED at this time prior to discharge.    Final Clinical Impressions(s) / ED Diagnoses   Final diagnoses:  Strep pharyngitis    ED Discharge Orders        Ordered    amoxicillin (AMOXIL) 500 MG capsule  2 times daily     08/08/17 0529  Melene PlanFloyd, Silvana Holecek, DO 08/08/17 [phone]

## 2017-08-08 NOTE — Discharge Instructions (Signed)
Follow up with your PCP, return for inability to swallow, difficulty turning your neck.

## 2017-10-30 ENCOUNTER — Emergency Department (HOSPITAL_COMMUNITY)
Admission: EM | Admit: 2017-10-30 | Discharge: 2017-10-30 | Disposition: A | Discharge status: Home / Self Care | Payer: Self-pay | Attending: Emergency Medicine | Admitting: Emergency Medicine

## 2017-10-30 ENCOUNTER — Encounter (HOSPITAL_COMMUNITY): Payer: Self-pay | Admitting: *Deleted

## 2017-10-30 DIAGNOSIS — L0291 Cutaneous abscess, unspecified: Secondary | ICD-10-CM

## 2017-10-30 DIAGNOSIS — Z79899 Other long term (current) drug therapy: Secondary | ICD-10-CM | POA: Insufficient documentation

## 2017-10-30 DIAGNOSIS — L02213 Cutaneous abscess of chest wall: Secondary | ICD-10-CM | POA: Insufficient documentation

## 2017-10-30 NOTE — ED Provider Notes (Signed)
[facility] Provider Note   CSN: [id number] Arrival date & time: 10/30/17  [id number]   History   Chief Complaint Chief Complaint  Patient presents with  . Abscess    HPI Anna Freeman is a 22 y.o. female who presents for revaluation of abscess. States she noted pain to her left upper breast and axilla 3 days ago. Was seen yesterday by urgent care and prescribed Bactrim. States she has had pain to the area. Pain is rated a 6/10. Pain does not radiate.The area was outlined by urgent care and the erythema has not spread beyond the outline. Denies fever, chills, increased erythema, edema or drainage from the area, nausea, vomiting, chest pain, SOB. Admits to chills 4 days ago, no symptoms since. States she sleeps on her stomach and this area is painful when laying down. Has not tried Tylenol or Ibuprofen for the pain.  History provided by patient.  Interpreter was used. HPI  Past Medical History:  Diagnosis Date  . Medical history non-contributory     Patient Active Problem List   Diagnosis Date Noted  . Uterine contractions during pregnancy 12/28/2015  . [redacted] weeks gestation of pregnancy   . Hereditary disease in family possibly affecting fetus     Past Surgical History:  Procedure Laterality Date  . NO PAST SURGERIES       OB History    Gravida  3   Para  2   Term  2   Preterm  0   AB  1   Living  2     SAB  1   TAB  0   Ectopic  0   Multiple  0   Live Births  2           Home Medications    Prior to Admission medications   Medication Sig Start Date End Date Taking? Authorizing Provider  amoxicillin (AMOXIL) 500 MG capsule Take 2 capsules (1,000 mg total) by mouth 2 (two) times daily. 08/08/17   Melene PlanFloyd, Dan, DO  ibuprofen (ADVIL,MOTRIN) 600 MG tablet Take 1 tablet (600 mg total) by mouth every 6 (six) hours as needed. 01/10/17   Nira Connardama, Pedro Eduardo, MD  ondansetron (ZOFRAN ODT) 4 MG disintegrating tablet  Take 1 tablet (4 mg total) by mouth every 8 (eight) hours as needed for nausea or vomiting. 07/08/17   Robinson, [place] N, PA-C  Prenatal Vit-Fe Fumarate-FA (PRENATAL MULTIVITAMIN) TABS tablet Take 1 tablet by mouth daily.     [provider]    Family History Family History  Problem Relation Age of Onset  . Heart disease Sister     Social History Social History   Tobacco Use  . Smoking status: Never Smoker  . Smokeless tobacco: Never Used  Substance Use Topics  . Alcohol use: No  . Drug use: No     Allergies   Patient has no known allergies.   Review of Systems Review of Systems Review of systems negative unless otherwise stated int he HPI.  Physical Exam Updated Vital Signs BP 123/72 (BP Location: Right Arm)   Pulse 93   Temp 99 F (37.2 C) (Oral)   Resp 20   SpO2 100%   Physical Exam  Constitutional: She appears well-developed and well-nourished. No distress.  HENT:  Head: Atraumatic.  Eyes: Pupils are equal, round, and reactive to light.  Neck: Normal range of motion. Neck supple.  Cardiovascular: Normal rate, regular rhythm, normal heart sounds and intact distal  pulses. Exam reveals no gallop and no friction rub.  No murmur heard. Pulmonary/Chest: Effort normal and breath sounds normal. No stridor. No respiratory distress. She has no wheezes. She has no rales. She exhibits tenderness.  Abdominal: Soft. Bowel sounds are normal. She exhibits no distension and no mass. There is no tenderness. There is no rebound and no guarding.  Musculoskeletal: Normal range of motion.  Neurological: She is alert.  Skin: Skin is warm and dry. She is not diaphoretic.  Erythema and warmth to the upper right quadrant of right breast. Erythema measures approximately 8cmx8cm. Mild induration. No area of fluctuance.  No streaking. Area was previously marked by urgent care, erythema has not spread beyond markings made yesterday 10/29/2016. Tenderness to palpation of the right  upper quadrant of breast.  Psychiatric: She has a normal mood and affect.  Nursing note and vitals reviewed.    ED Treatments / Results  Labs (all labs ordered are listed, but only abnormal results are displayed) Labs Reviewed - No data to display  EKG None  Radiology No results found.  Procedures Procedures (including critical care time)  Medications Ordered in ED Medications - No data to display   Initial Impression / Assessment and Plan / ED Course  I have reviewed the triage vital signs and the nursing notes well his past medical history.  Pertinent labs & imaging results that were available during my care of the patient were reviewed by me and considered in my medical decision making (see chart for details).  Patient presents for evaluation of right axilla/right breast abscess.  Afebrile, nonseptic, non-ill-appearing.  Was seen yesterday 10/29/2017 at [facility], the area was marked.  Erythema has not spread beyond demarcated area.  Was prescribed Bactrim.  At this time she has only had 1 dose of Bactrim.  No area of fluctuance to drain at this time.  Discussed warm soaks and recheck in 2 days. Discussed to continue on Abx and strict return precautions. Stable for dc home. Patient voiced understanding.    Final Clinical Impressions(s) / ED Diagnoses   Final diagnoses:  Abscess    ED Discharge Orders    None       Daemien Fronczak A, PA-C 10/30/17 1610    Arby Barrette, MD 11/04/17 [phone]

## 2017-10-30 NOTE — ED Notes (Signed)
ED Provider at bedside. 

## 2017-10-30 NOTE — ED Notes (Signed)
Pt verbalizes understanding of d/c instructions. Pt ambulatory at d/c with all belongings.   

## 2017-10-30 NOTE — Discharge Instructions (Addendum)
You were evaluated today for a skin abscess. Continue taking the antibiotics as prescribed. You may take Tylenol as needed for pain. Please take as prescribed.  Your abscess did not have any area that we could drain at this time. You may try a warm compress to the area 3-4x day. When the area comes to a head or start draining please follow-up with your primary care provider or return to the ED for reevaluation. Return to the ED with any of the following symptoms: SOLICITE AYUDA SI: Aumentan el enrojecimiento, la hinchazn o el dolor alrededor del absceso. Aumenta la cantidad de lquido o de sangre que sale del absceso. Siente el absceso caliente cuando lo toca. Aumenta la cantidad de pus o percibe mal [place] que sale del absceso. Tiene fiebre. Tiene dolor muscular. Tiene escalofros. Se siente mal. SOLICITE AYUDA DE INMEDIATO SI: Siente mucho dolor (intenso). Observa lneas rojas que se extienden desde el absceso.

## 2017-10-30 NOTE — ED Triage Notes (Signed)
Pt in with abscess to her right axilla for the last 4 days, reports intermittent fever, started on antibiotics with no improvement

## 2018-04-29 ENCOUNTER — Encounter (HOSPITAL_COMMUNITY): Payer: Self-pay | Admitting: *Deleted

## 2018-04-29 ENCOUNTER — Emergency Department (HOSPITAL_COMMUNITY): Payer: Self-pay

## 2018-04-29 ENCOUNTER — Other Ambulatory Visit: Payer: Self-pay

## 2018-04-29 ENCOUNTER — Emergency Department (HOSPITAL_COMMUNITY)
Admission: EM | Admit: 2018-04-29 | Discharge: 2018-04-29 | Disposition: A | Discharge status: Home / Self Care | Payer: Self-pay | Attending: Emergency Medicine | Admitting: Emergency Medicine

## 2018-04-29 DIAGNOSIS — R0789 Other chest pain: Secondary | ICD-10-CM | POA: Insufficient documentation

## 2018-04-29 DIAGNOSIS — M549 Dorsalgia, unspecified: Secondary | ICD-10-CM | POA: Insufficient documentation

## 2018-04-29 DIAGNOSIS — M25512 Pain in left shoulder: Secondary | ICD-10-CM | POA: Insufficient documentation

## 2018-04-29 LAB — PREGNANCY, URINE: PREG TEST UR: NEGATIVE

## 2018-04-29 NOTE — ED Provider Notes (Signed)
[facility]-EMERGENCY DEPT Provider Note   CSN: [id number] Arrival date & time: 04/29/18  1851    History   Chief Complaint Chief Complaint  Patient presents with  . Arm Pain    left    HPI Anna Freeman is a 23 y.o. female.     23 year old female with prior medical history as detailed below presents for evaluation of left-sided chest, shoulder, and left-sided upper back pain.  Symptoms have been ongoing for the last week.  Patient's symptoms have been ongoing over the last week.  Symptoms are worse with movement.  She denies any specific injury.  She denies associated diaphoresis, nausea, vomiting, shortness of breath, or other complaint.  She is not taking anything for her symptoms at home.  The history is provided by the patient and medical records. The history is limited by a language barrier. A language interpreter was used.  Chest Pain  Pain location:  L chest Pain quality: aching   Pain radiates to:  Does not radiate Pain severity:  Mild Onset quality:  Gradual Duration:  1 week Timing:  Constant Progression:  Waxing and waning Chronicity:  New Context: lifting and movement   Relieved by:  Nothing Worsened by:  Nothing Ineffective treatments:  None tried   Past Medical History:  Diagnosis Date  . Medical history non-contributory     Patient Active Problem List   Diagnosis Date Noted  . Uterine contractions during pregnancy 12/28/2015  . [redacted] weeks gestation of pregnancy   . Hereditary disease in family possibly affecting fetus     Past Surgical History:  Procedure Laterality Date  . NO PAST SURGERIES       OB History    Gravida  3   Para  2   Term  2   Preterm  0   AB  1   Living  2     SAB  1   TAB  0   Ectopic  0   Multiple  0   Live Births  2            Home Medications    Prior to Admission medications   Medication Sig Start Date End Date Taking? Authorizing Provider  amoxicillin (AMOXIL)  500 MG capsule Take 2 capsules (1,000 mg total) by mouth 2 (two) times daily. Patient not taking: Reported on 04/29/2018 08/08/17   Melene Plan, DO  ibuprofen (ADVIL,MOTRIN) 600 MG tablet Take 1 tablet (600 mg total) by mouth every 6 (six) hours as needed. Patient not taking: Reported on 04/29/2018 01/10/17   Nira Conn, MD  ondansetron (ZOFRAN ODT) 4 MG disintegrating tablet Take 1 tablet (4 mg total) by mouth every 8 (eight) hours as needed for nausea or vomiting. Patient not taking: Reported on 04/29/2018 07/08/17   Robinson, [place] N, PA-C    Family History Family History  Problem Relation Age of Onset  . Heart disease Sister     Social History Social History   Tobacco Use  . Smoking status: Never Smoker  . Smokeless tobacco: Never Used  Substance Use Topics  . Alcohol use: Yes    Comment: socially  . Drug use: No     Allergies   Patient has no known allergies.   Review of Systems Review of Systems  Cardiovascular: Positive for chest pain.  All other systems reviewed and are negative.    Physical Exam Updated Vital Signs BP 112/77 (BP Location: Right Arm)   Pulse 74  Temp 98.3 F (36.8 C) (Oral)   Resp 12   Ht 5' (1.524 m)   Wt 66.4 kg   LMP  (Approximate)   SpO2 98%   BMI 28.59 kg/m   Physical Exam Vitals signs and nursing note reviewed.  Constitutional:      General: She is not in acute distress.    Appearance: She is well-developed.  HENT:     Head: Normocephalic and atraumatic.  Eyes:     Conjunctiva/sclera: Conjunctivae normal.     Pupils: Pupils are equal, round, and reactive to light.  Neck:     Musculoskeletal: Normal range of motion and neck supple.  Cardiovascular:     Rate and Rhythm: Normal rate and regular rhythm.     Heart sounds: Normal heart sounds.  Pulmonary:     Effort: Pulmonary effort is normal. No respiratory distress.     Breath sounds: Normal breath sounds.  Abdominal:     General: There is no distension.      Palpations: Abdomen is soft.     Tenderness: There is no abdominal tenderness.  Musculoskeletal: Normal range of motion.        General: No deformity.     Comments: Mild tenderness with palpation to the anterior left chest wall.  Symptoms are reproduced entirely with palpitation  Skin:    General: Skin is warm and dry.  Neurological:     Mental Status: She is alert and oriented to person, place, and time.      ED Treatments / Results  Labs (all labs ordered are listed, but only abnormal results are displayed) Labs Reviewed  PREGNANCY, URINE    EKG EKG Interpretation  Date/Time:  Thursday April 29 2018 20:12:47 EDT Ventricular Rate:  77 PR Interval:    QRS Duration: 92 QT Interval:  386 QTC Calculation: 437 R Axis:   75 Text Interpretation:  Sinus rhythm Confirmed by Kristine Royal [id number]) on 04/29/2018 8:57:42 PM   Radiology Dg Chest 2 View  Result Date: 04/29/2018 CLINICAL DATA:  23 year old female with chest pain radiating to the left arm for 1 week. EXAM: CHEST - 2 VIEW COMPARISON:  None. FINDINGS: Somewhat low lung volumes. Mediastinal contours are within normal limits. Visualized tracheal air column is within normal limits. Both lungs appear clear. No pneumothorax or pleural effusion. Negative visible bowel gas pattern. No osseous abnormality identified. IMPRESSION: Negative aside from somewhat low lung volumes. Electronically Signed   By: Odessa Fleming M.D.   On: 04/29/2018 20:32    Procedures Procedures (including critical care time)  Medications Ordered in ED Medications - No data to display   Initial Impression / Assessment and Plan / ED Course  I have reviewed the triage vital signs and the nursing notes.  Pertinent labs & imaging results that were available during my care of the patient were reviewed by me and considered in my medical decision making (see chart for details).        MDM  Screen complete  Patient is presenting for evaluation of reported  discomfort of the left chest wall.  Patient's described symptoms are consistent with likely muscular pain.  EKG obtained in the ED is without evidence of acute ischemia.  Chest x-ray is also without evidence of acute abnormality.  Patient's describe symptoms are not consistent with likely ACS.  Patient does appear to be appropriate for discharge.  Importance of close follow-up is stressed.  Strict return precautions given and understood.  Final Clinical Impressions(s) / ED Diagnoses  Final diagnoses:  Chest wall pain    ED Discharge Orders    None       Wynetta Fines, MD 04/29/18 2103

## 2018-04-29 NOTE — Discharge Instructions (Addendum)
Please return for any problem.  Follow-up with your regular care provider as instructed.  Take ibuprofen --600 mg taken orally every 8 hours -- for your pain.

## 2018-04-29 NOTE — ED Triage Notes (Signed)
Through interpreter.  Pt c/o left arm pain with radiation to left chest x 1 week.  Pt denies n/v/d or trauma/injury.  Last night pt c/o dizziness and blurred vision that lasted only for a little while.  Called the clinic and they told her to come here to be checked.  3 months ago they removed the implant from her left arm and right of the area where they removed it is where she feels the pull.

## 2019-10-27 ENCOUNTER — Encounter (HOSPITAL_COMMUNITY): Payer: Self-pay | Admitting: Emergency Medicine

## 2019-10-27 ENCOUNTER — Emergency Department (HOSPITAL_COMMUNITY)
Admission: EM | Admit: 2019-10-27 | Discharge: 2019-10-27 | Disposition: A | Discharge status: Home / Self Care | Payer: Self-pay | Attending: Emergency Medicine | Admitting: Emergency Medicine

## 2019-10-27 ENCOUNTER — Other Ambulatory Visit: Payer: Self-pay

## 2019-10-27 DIAGNOSIS — M5432 Sciatica, left side: Secondary | ICD-10-CM

## 2019-10-27 DIAGNOSIS — M5442 Lumbago with sciatica, left side: Secondary | ICD-10-CM | POA: Insufficient documentation

## 2019-10-27 MED ORDER — NAPROXEN 500 MG PO TABS: 500.0000 mg | ORAL_TABLET | Freq: Two times a day (BID) | ORAL | 0 refills | Status: AC

## 2019-10-27 NOTE — ED Provider Notes (Signed)
[facility] EMERGENCY DEPARTMENT Provider Note   CSN: [id number] Arrival date & time: 10/27/19  5009     History Chief Complaint  Patient presents with  . Hip Pain  . Leg Pain    Anna Freeman is a 24 y.o. female.  HPI   24 year old female presenting to the emergency department today for evaluation of left lower back pain that has been ongoing for the last 4 days. She denies any recent falls or trauma but did have a fall several years ago and since then has had intermittent pain in this area. The pain radiates down the left lower extremity. She has intermittent numbness but no persistent numbness. Denies any weakness. Denies any history of IV drug use, fevers or other associated symptoms.  Past Medical History:  Diagnosis Date  . Medical history non-contributory     Patient Active Problem List   Diagnosis Date Noted  . Uterine contractions during pregnancy 12/28/2015  . [redacted] weeks gestation of pregnancy   . Hereditary disease in family possibly affecting fetus     Past Surgical History:  Procedure Laterality Date  . NO PAST SURGERIES       OB History    Gravida  3   Para  2   Term  2   Preterm  0   AB  1   Living  2     SAB  1   TAB  0   Ectopic  0   Multiple  0   Live Births  2           Family History  Problem Relation Age of Onset  . Heart disease Sister     Social History   Tobacco Use  . Smoking status: Never Smoker  . Smokeless tobacco: Never Used  Vaping Use  . Vaping Use: Never used  Substance Use Topics  . Alcohol use: Yes    Comment: socially  . Drug use: No    Home Medications Prior to Admission medications   Medication Sig Start Date End Date Taking? Authorizing Provider  amoxicillin (AMOXIL) 500 MG capsule Take 2 capsules (1,000 mg total) by mouth 2 (two) times daily. Patient not taking: Reported on 04/29/2018 08/08/17   Melene Plan, DO  ibuprofen (ADVIL,MOTRIN) 600 MG tablet Take 1 tablet (600  mg total) by mouth every 6 (six) hours as needed. Patient not taking: Reported on 04/29/2018 01/10/17   Nira Conn, MD  naproxen (NAPROSYN) 500 MG tablet Take 1 tablet (500 mg total) by mouth 2 (two) times daily for 7 days. 10/27/19 11/03/19  Anis Cinelli S, PA-C  ondansetron (ZOFRAN ODT) 4 MG disintegrating tablet Take 1 tablet (4 mg total) by mouth every 8 (eight) hours as needed for nausea or vomiting. Patient not taking: Reported on 04/29/2018 07/08/17   Robinson, [place] N, PA-C    Allergies    Patient has no known allergies.  Review of Systems   Review of Systems  Constitutional: Negative for fever.  Eyes: Negative for visual disturbance.  Respiratory: Negative for shortness of breath.   Cardiovascular: Negative for chest pain.  Gastrointestinal: Negative for abdominal pain.  Genitourinary: Negative for pelvic pain.  Musculoskeletal: Positive for back pain.  Skin: Negative for color change.    Physical Exam Updated Vital Signs BP 100/69   Pulse 69   Temp 98 F (36.7 C)   Resp 20   SpO2 95%   Physical Exam Vitals and nursing note reviewed.  Constitutional:  General: She is not in acute distress.    Appearance: She is well-developed.  HENT:     Head: Normocephalic and atraumatic.  Eyes:     Conjunctiva/sclera: Conjunctivae normal.  Cardiovascular:     Rate and Rhythm: Normal rate.  Pulmonary:     Effort: Pulmonary effort is normal.  Musculoskeletal:        General: Normal range of motion.     Cervical back: Neck supple.     Comments: TTP to the left lower back over the sciatic notch. No unilateral leg swelling. 5/5 strength to the BLE. Normal sensation throughout. Ambulatory with steady gait.   Skin:    General: Skin is warm and dry.  Neurological:     Mental Status: She is alert.     ED Results / Procedures / Treatments   Labs (all labs ordered are listed, but only abnormal results are displayed) Labs Reviewed - No data to  display  EKG None  Radiology No results found.  Procedures Procedures (including critical care time)  Medications Ordered in ED Medications - No data to display  ED Course  I have reviewed the triage vital signs and the nursing notes.  Pertinent labs & imaging results that were available during my care of the patient were reviewed by me and considered in my medical decision making (see chart for details).    MDM Rules/Calculators/A&P                          Normal neurological exam, no evidence of urinary incontinence or retention, pain is consistently reproducible. There is no evidence of AAA or concern for dissection at this time.   Patient can walk but states is painful.  No loss of bowel or bladder control.  No concern for cauda equina.  No fever, night sweats, weight loss, h/o cancer, IVDU.  Pain treated here in the department with adequate improvement. RICE protocol and pain medicine indicated and discussed with patient. I have also discussed reasons to return immediately to the ER.  Patient expresses understanding and agrees with plan.   Final Clinical Impression(s) / ED Diagnoses Final diagnoses:  Sciatica of left side    Rx / DC Orders ED Discharge Orders         Ordered    naproxen (NAPROSYN) 500 MG tablet  2 times daily        10/27/19 [address] Devyne Hauger S, PA-C 10/27/19 1315    Arby Barrette, MD 10/28/19 [phone]

## 2019-10-27 NOTE — Discharge Instructions (Addendum)
You may alternate taking Tylenol and Naproxen as needed for pain control. You may take Naproxen twice daily as directed on your discharge paperwork and you may take  [phone] mg of Tylenol every 6 hours. Do not exceed 4000 mg of Tylenol daily as this can lead to liver damage. Also, make sure to take Naproxen with meals as it can cause an upset stomach. Do not take other NSAIDs while taking Naproxen such as (Aleve, Ibuprofen, Aspirin, Celebrex, etc) and do not take more than the prescribed dose as this can lead to ulcers and bleeding in your GI tract. You may use warm and cold compresses to help with your symptoms.   Please follow up with your primary doctor within the next 7-10 days for re-evaluation and further treatment of your symptoms.   Return to the emergency department immediately if you experience any back pain associated with fevers, loss of control of your bowels/bladder, weakness/numbness to your legs, numbness to your groin area, inability to walk, or inability to urinate.    ---------------------------------------   Puede alternar la toma de Tylenol y Naproxen segn sea necesario para controlar el dolor. Puede tomar [employer] al da como se indica en su documentacin de alta y puede tomar [phone] mg de Tylenol cada 6 horas. No exceda los 4000 mg de Tylenol al da, ya que esto puede provocar dao heptico. Adems, asegrese de tomar naproxeno con las comidas, ya que puede causar Programme researcher, broadcasting/film/video. No tome otros AINE mientras toma naproxeno como (Aleve, ibuprofeno, aspirina, Celebrex, etc.) y no tome ms de la dosis prescrita, ya que esto puede provocar lceras y sangrado en su tracto gastrointestinal. Puede usar compresas fras y calientes para ayudar con sus sntomas.  Haga un seguimiento con su mdico de atencin primaria dentro de los prximos 7 a 10 das para una reevaluacin y tratamiento adicional de sus sntomas.  Regrese a la sala de emergencias de inmediato si  experimenta cualquier dolor de espalda asociado con fiebre, prdida del control de sus intestinos / vejiga, debilidad / entumecimiento en las piernas, entumecimiento en el rea de la ingle, incapacidad para caminar o incapacidad para orinar.

## 2019-10-27 NOTE — ED Triage Notes (Signed)
C/o L hip pain that radiates down L leg x 4 days.  Taking Tylenol without relief.  Denies recent injury.  History of L hip pain after fall a few years ago.

## 2020-04-25 ENCOUNTER — Encounter (HOSPITAL_COMMUNITY): Payer: Self-pay

## 2020-04-25 ENCOUNTER — Other Ambulatory Visit: Payer: Self-pay

## 2020-04-25 ENCOUNTER — Emergency Department (HOSPITAL_COMMUNITY)
Admission: EM | Admit: 2020-04-25 | Discharge: 2020-04-25 | Disposition: A | Discharge status: Home / Self Care | Payer: Self-pay | Attending: Emergency Medicine | Admitting: Emergency Medicine

## 2020-04-25 DIAGNOSIS — G514 Facial myokymia: Secondary | ICD-10-CM | POA: Insufficient documentation

## 2020-04-25 DIAGNOSIS — R079 Chest pain, unspecified: Secondary | ICD-10-CM | POA: Insufficient documentation

## 2020-04-25 DIAGNOSIS — R202 Paresthesia of skin: Secondary | ICD-10-CM | POA: Insufficient documentation

## 2020-04-25 DIAGNOSIS — R632 Polyphagia: Secondary | ICD-10-CM | POA: Insufficient documentation

## 2020-04-25 LAB — TSH: TSH: 4.794 u[IU]/mL — ABNORMAL HIGH (ref 0.350–4.500)

## 2020-04-25 LAB — BASIC METABOLIC PANEL
Anion gap: 9 (ref 5–15)
BUN: 11 mg/dL (ref 6–20)
CO2: 22 mmol/L (ref 22–32)
Calcium: 9.3 mg/dL (ref 8.9–10.3)
Chloride: 103 mmol/L (ref 98–111)
Creatinine, Ser: 0.7 mg/dL (ref 0.44–1.00)
GFR, Estimated: >60 mL/min (ref >60)
Glucose, Bld: 90 mg/dL (ref 70–99)
Potassium: 3.7 mmol/L (ref 3.5–5.1)
Sodium: 134 mmol/L — ABNORMAL LOW (ref 135–145)

## 2020-04-25 LAB — URINALYSIS, ROUTINE W REFLEX MICROSCOPIC
Bacteria, UA: NONE SEEN
Bilirubin Urine: NEGATIVE
Glucose, UA: NEGATIVE mg/dL
Hgb urine dipstick: NEGATIVE
Ketones, ur: NEGATIVE mg/dL
Nitrite: NEGATIVE
Protein, ur: NEGATIVE mg/dL
Specific Gravity, Urine: 1.02 (ref 1.005–1.030)
pH: 5 (ref 5.0–8.0)

## 2020-04-25 LAB — CBC
HCT: 41 % (ref 36.0–46.0)
Hemoglobin: 14 g/dL (ref 12.0–15.0)
MCH: 30.8 pg (ref 26.0–34.0)
MCHC: 34.1 g/dL (ref 30.0–36.0)
MCV: 90.1 fL (ref 80.0–100.0)
Platelets: 267 10*3/uL (ref 150–400)
RBC: 4.55 MIL/uL (ref 3.87–5.11)
RDW: 11.9 % (ref 11.5–15.5)
WBC: 8 10*3/uL (ref 4.0–10.5)
nRBC: 0 % (ref 0.0–0.2)

## 2020-04-25 LAB — PREGNANCY, URINE: Preg Test, Ur: NEGATIVE

## 2020-04-25 MED ORDER — HYDROXYZINE HCL 25 MG PO TABS: 25.0000 mg | ORAL_TABLET | Freq: Three times a day (TID) | ORAL | 0 refills | Status: AC

## 2020-04-25 NOTE — ED Provider Notes (Signed)
[facility] EMERGENCY DEPARTMENT Provider Note   CSN: [id number] Arrival date & time: 04/25/20  0350     History Chief Complaint  Patient presents with  . Eye Twitching    Anna Freeman is a 25 y.o. female.  25 year old female presents to the emergency department for evaluation of eye twitching.  She has been experiencing frequent twitching of her left upper eyelid for the past few weeks.  Now has noticed some twitching of her right lower eyelid.  Notices symptoms more when she is not preoccupied.  Reports some associated chest tightness with facial and hand paresthesias when ruminating on her symptoms.  Is concerned that her blood pressure may be high. No fevers, vision loss, facial drooping, hx of trauma. Denies hx of thyroid issues.  The history is provided by the patient. A language interpreter was used ([employer]).       Past Medical History:  Diagnosis Date  . Medical history non-contributory     Patient Active Problem List   Diagnosis Date Noted  . Uterine contractions during pregnancy 12/28/2015  . [redacted] weeks gestation of pregnancy   . Hereditary disease in family possibly affecting fetus     Past Surgical History:  Procedure Laterality Date  . NO PAST SURGERIES       OB History    Gravida  3   Para  2   Term  2   Preterm  0   AB  1   Living  2     SAB  1   IAB  0   Ectopic  0   Multiple  0   Live Births  2           Family History  Problem Relation Age of Onset  . Heart disease Sister     Social History   Tobacco Use  . Smoking status: Never Smoker  . Smokeless tobacco: Never Used  Vaping Use  . Vaping Use: Never used  Substance Use Topics  . Alcohol use: Yes    Comment: socially  . Drug use: No    Home Medications Prior to Admission medications   Not on File    Allergies    Patient has no known allergies.  Review of Systems   Review of Systems  Ten systems reviewed and are negative  for acute change, except as noted in the HPI.    Physical Exam Updated Vital Signs BP 100/77   Pulse 75   Temp 97.6 F (36.4 C) (Oral)   Resp 16   Ht 5' (1.524 m)   Wt 66.4 kg   SpO2 95%   BMI 28.59 kg/m   Physical Exam Vitals and nursing note reviewed.  Constitutional:      General: She is not in acute distress.    Appearance: She is well-developed and well-nourished. She is not diaphoretic.     Comments: Nontoxic appearing and in NAD  HENT:     Head: Normocephalic and atraumatic.     Comments: Negative Chvostek's sign.    Right Ear: External ear normal.     Left Ear: External ear normal.  Eyes:     General: No scleral icterus.    Extraocular Movements: Extraocular movements intact and EOM normal.     Conjunctiva/sclera: Conjunctivae normal.     Pupils: Pupils are equal, round, and reactive to light.     Comments: No nystagmus  Pulmonary:     Effort: Pulmonary effort is normal. No respiratory  distress.     Comments: Respirations even and unlabored Musculoskeletal:        General: Normal range of motion.     Cervical back: Normal range of motion.  Skin:    General: Skin is warm and dry.     Coloration: Skin is not pale.     Findings: No erythema or rash.  Neurological:     Mental Status: She is alert and oriented to person, place, and time.     Comments: GCS 15. Speech is goal oriented. No cranial nerve deficits appreciated; symmetric eyebrow raise, no facial drooping, tongue midline. Patient has equal grip strength bilaterally with 5/5 strength against resistance in all major muscle groups bilaterally. Sensation to light touch intact. Patient moves extremities without ataxia. Patient ambulatory with steady gait.  Psychiatric:        Mood and Affect: Mood and affect normal.        Behavior: Behavior normal.     ED Results / Procedures / Treatments   Labs (all labs ordered are listed, but only abnormal results are displayed) Labs Reviewed  BASIC METABOLIC PANEL   TSH  CBC    EKG None  Radiology No results found.  Procedures Procedures   Medications Ordered in ED Medications - No data to display  ED Course  I have reviewed the triage vital signs and the nursing notes.  Pertinent labs & imaging results that were available during my care of the patient were reviewed by me and considered in my medical decision making (see chart for details).    MDM Rules/Calculators/A&P                          25 year old female presenting for eyelid twitching and spasm.  No focal neurologic deficit.  Symptoms consistent with involuntary muscle movements.  Will check electrolytes, thyroid.  Stable for discharge if reassuring.  Care signed out to [place], PA-C at shift change.   Final Clinical Impression(s) / ED Diagnoses Final diagnoses:  Eyelid myokymia    Rx / DC Orders ED Discharge Orders    None       Antony Madura, PA-C 04/25/20 0645    Nira Conn, MD 04/25/20 [phone]

## 2020-04-25 NOTE — Discharge Instructions (Addendum)
Your work up today was reassuring. Eye twitching can have causes that aren't due to underlying disease. Examples include muscle twitches, fatigue, sleep deprivation, or prolonged screen time. Resting, reducing stress, and decreasing caffeine may help resolve eye twitching within a few days or weeks. Follow up with a primary care doctor, as needed, for persistent symptoms.  Le he recetado medicina para ayudar con sus simptomas. Tome 1 tableta tres veces al dia. El numero de [facility] esta en su chart, llame para obtener una cita. Si sus simptomas empeoran regrese a Radio broadcast assistant.

## 2020-04-25 NOTE — ED Provider Notes (Signed)
  Physical Exam  BP 108/75 (BP Location: Right Arm)   Pulse 73   Temp 97.9 F (36.6 C) (Oral)   Resp 16   Ht 5' (1.524 m)   Wt 66.4 kg   SpO2 97%   BMI 28.59 kg/m   Physical Exam  ED Course/Procedures     Procedures  MDM  Patient care assumed  From [place] H. PA at shift change, please see her note for a full HPI. Briefly, patient here with eye twitching which has now resolved.  No prior history of thyroid disease.Plan is for follow-up on labs and reevaluate patient pending disposition.  7:47 AM patient was evaluated by me, symptoms have resolved.  She does report she is under a lot of stress, has been eating a lot, has had more appetite than usual.  She feels like having episodes that she feels her chest gets tight and this radiates to her neck and feels like she is hard to breathe, she is likely describing a panic attack to me.  No interpreter was used at this time as I was able to communicate with patient in [place].  Her last period was a couple months ago, she reports this is irregular and this was different than usual's.  We will also obtain a UA in order to rule out any pregnancy at this time.  UA without any signs of infection, pregnancy test is negative.  We discussed treatment for anxiety disorder with Atarax, she will also get a referral to the [facility] wellness clinic in order to obtain further follow-up.  Patient has remained stable, patient stable for discharge.   Portions of this note were generated with Scientist, clinical (histocompatibility and immunogenetics). Dictation errors may occur despite best attempts at proofreading.          Claude Manges, PA-C 04/25/20 0914    Nira Conn, MD 04/26/20 [phone]

## 2020-04-25 NOTE — ED Triage Notes (Signed)
Patient reports eye twitching to L eye that has not spread into her head, now with some numbness and headache in her head. Think it may be related to her blood pressure

## 2020-12-27 ENCOUNTER — Emergency Department (HOSPITAL_COMMUNITY): Payer: Self-pay

## 2020-12-27 ENCOUNTER — Encounter (HOSPITAL_COMMUNITY): Payer: Self-pay | Admitting: Emergency Medicine

## 2020-12-27 ENCOUNTER — Emergency Department (HOSPITAL_COMMUNITY)
Admission: EM | Admit: 2020-12-27 | Discharge: 2020-12-28 | Disposition: A | Discharge status: Home / Self Care | Payer: Self-pay | Attending: Student | Admitting: Student

## 2020-12-27 ENCOUNTER — Other Ambulatory Visit: Payer: Self-pay

## 2020-12-27 DIAGNOSIS — R0789 Other chest pain: Secondary | ICD-10-CM | POA: Insufficient documentation

## 2020-12-27 DIAGNOSIS — R112 Nausea with vomiting, unspecified: Secondary | ICD-10-CM | POA: Insufficient documentation

## 2020-12-27 DIAGNOSIS — R0602 Shortness of breath: Secondary | ICD-10-CM | POA: Insufficient documentation

## 2020-12-27 DIAGNOSIS — Z5321 Procedure and treatment not carried out due to patient leaving prior to being seen by health care provider: Secondary | ICD-10-CM | POA: Insufficient documentation

## 2020-12-27 LAB — BASIC METABOLIC PANEL
Anion gap: 12 (ref 5–15)
BUN: 9 mg/dL (ref 6–20)
CO2: 22 mmol/L (ref 22–32)
Calcium: 9.5 mg/dL (ref 8.9–10.3)
Chloride: 102 mmol/L (ref 98–111)
Creatinine, Ser: 0.71 mg/dL (ref 0.44–1.00)
GFR, Estimated: >60 mL/min (ref >60)
Glucose, Bld: 114 mg/dL — ABNORMAL HIGH (ref 70–99)
Potassium: 3.4 mmol/L — ABNORMAL LOW (ref 3.5–5.1)
Sodium: 136 mmol/L (ref 135–145)

## 2020-12-27 LAB — CBC
HCT: 42.6 % (ref 36.0–46.0)
Hemoglobin: 14.4 g/dL (ref 12.0–15.0)
MCH: 30.5 pg (ref 26.0–34.0)
MCHC: 33.8 g/dL (ref 30.0–36.0)
MCV: 90.3 fL (ref 80.0–100.0)
Platelets: 294 10*3/uL (ref 150–400)
RBC: 4.72 MIL/uL (ref 3.87–5.11)
RDW: 11.9 % (ref 11.5–15.5)
WBC: 10.1 10*3/uL (ref 4.0–10.5)
nRBC: 0 % (ref 0.0–0.2)

## 2020-12-27 LAB — I-STAT BETA HCG BLOOD, ED (MC, WL, AP ONLY): I-stat hCG, quantitative: <5 m[IU]/mL (ref <5)

## 2020-12-27 LAB — TROPONIN I (HIGH SENSITIVITY): Troponin I (High Sensitivity): 5 ng/L (ref <18)

## 2020-12-27 NOTE — ED Provider Notes (Signed)
Emergency Medicine Provider Triage Evaluation Note  Anna Freeman , a 25 y.o. female  was evaluated in triage.  Pt complains of chest pain.  Started acutely 1 hour ago, feels it all over her chest wall and up to her neck.  Feels like pressure, does not radiate to the back or the arm.  Pain lasts for few minutes and then resolves independently.  Unable to identify any alleviating or aggravating factors.  There is associated shortness of breath, no nausea or vomiting.  Patient does not take any medicine for hypertension or diabetes, does not smoke cigarettes, family history of a sister with heart disease unsure diagnosis.  Review of Systems  Positive: Chest pain, shortness of breath Negative: Nausea, vomiting  Physical Exam  There were no vitals taken for this visit. Gen:   Awake, no distress   Resp:  Normal effort  MSK:   Moves extremities without difficulty  Other:  Lungs are clear to auscultation bilaterally, heart rate is elevated.  Radial pulses 2+ bilaterally  Medical Decision Making  Medically screening exam initiated at 8:56 PM.  Appropriate orders placed.  Anna Freeman was informed that the remainder of the evaluation will be completed by another provider, this initial triage assessment does not replace that evaluation, and the importance of remaining in the ED until their evaluation is complete.  Chest pain work-up   Theron Arista, Cordelia Poche 12/27/20 0539    Mancel Bale, MD 12/27/20 2245

## 2020-12-27 NOTE — ED Triage Notes (Signed)
Pt here for cp that started approx 30 min before arrival. Pt explains pain goes up into her throat, states this happened last year once.

## 2020-12-28 LAB — TROPONIN I (HIGH SENSITIVITY): Troponin I (High Sensitivity): 4 ng/L (ref <18)

## 2021-04-14 ENCOUNTER — Other Ambulatory Visit: Payer: Self-pay

## 2021-04-14 ENCOUNTER — Emergency Department (HOSPITAL_COMMUNITY)
Admission: EM | Admit: 2021-04-14 | Discharge: 2021-04-14 | Disposition: A | Discharge status: Home / Self Care | Payer: Self-pay | Attending: Student | Admitting: Student

## 2021-04-14 ENCOUNTER — Encounter (HOSPITAL_COMMUNITY): Payer: Self-pay

## 2021-04-14 DIAGNOSIS — M79601 Pain in right arm: Secondary | ICD-10-CM | POA: Insufficient documentation

## 2021-04-14 DIAGNOSIS — S4411XA Injury of median nerve at upper arm level, right arm, initial encounter: Secondary | ICD-10-CM | POA: Insufficient documentation

## 2021-04-14 DIAGNOSIS — S5411XA Injury of median nerve at forearm level, right arm, initial encounter: Secondary | ICD-10-CM

## 2021-04-14 DIAGNOSIS — X503XXA Overexertion from repetitive movements, initial encounter: Secondary | ICD-10-CM | POA: Insufficient documentation

## 2021-04-14 DIAGNOSIS — Y990 Civilian activity done for income or pay: Secondary | ICD-10-CM | POA: Insufficient documentation

## 2021-04-14 MED ORDER — NAPROXEN 375 MG PO TABS: 375.0000 mg | ORAL_TABLET | Freq: Two times a day (BID) | ORAL | 0 refills | Status: AC

## 2021-04-14 MED ORDER — NAPROXEN 375 MG PO TABS: 375.0000 mg | ORAL_TABLET | Freq: Two times a day (BID) | ORAL | 0 refills | Status: DC

## 2021-04-14 MED ORDER — NAPROXEN 250 MG PO TABS
375.0000 mg | ORAL_TABLET | Freq: Once | ORAL | Status: AC
  Administered 2021-04-14: 375 mg via ORAL
  Filled 2021-04-14: qty 2

## 2021-04-14 NOTE — ED Provider Notes (Signed)
[facility] EMERGENCY DEPARTMENT Provider Note  CSN: [id number] Arrival date & time: 04/14/21 3818  Chief Complaint(s) Arm Pain  HPI Anna Freeman is a 26 y.o. female who presents the emergency department for evaluation of right arm pain and hand pain/numbness.  Patient states for the last 2 weeks she has had worsening pain on the palmar aspect of the hand and difficulty with moving her third digit on the right.  She states that the pain starts near the radial groove, involves the medial elbow and shoots into the third digit.  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  She has not taken any medication at home for this.  Denies other known trauma.  Denies chest pain, shortness of breath, Donnell pain, nausea, vomiting or other systemic or traumatic complaints.   Arm Pain   Past Medical History Past Medical History:  Diagnosis Date   Medical history non-contributory    Patient Active Problem List   Diagnosis Date Noted   Uterine contractions during pregnancy 12/28/2015   [redacted] weeks gestation of pregnancy    Hereditary disease in family possibly affecting fetus    Home Medication(s) Prior to Admission medications   Medication Sig Start Date End Date Taking? Authorizing Provider  naproxen (NAPROSYN) 375 MG tablet Take 1 tablet (375 mg total) by mouth 2 (two) times daily. 04/14/21  Yes Lestine Rahe, MD                                                                                                                                    Past Surgical History Past Surgical History:  Procedure Laterality Date   NO PAST SURGERIES     Family History Family History  Problem Relation Age of Onset   Heart disease Sister     Social History Social History   Tobacco Use   Smoking status: Never   Smokeless tobacco: Never  Vaping Use   Vaping Use: Never used  Substance Use Topics   Alcohol use: Yes     Comment: socially   Drug use: No   Allergies Patient has no known allergies.  Review of Systems Review of Systems  Musculoskeletal:  Positive for arthralgias and myalgias.  Neurological:  Positive for numbness.   Physical Exam Vital Signs  I have reviewed the triage vital signs BP 131/80    Pulse 64    Temp (!) 97.4 F (36.3 C)    Resp 12    Ht 5' (1.524 m)    Wt 66.4 kg    SpO2 100%    BMI 28.59 kg/m   Physical Exam Vitals and nursing note reviewed.  Constitutional:      General: She is not in acute distress.    Appearance: She is well-developed.  HENT:     Head: Normocephalic and atraumatic.  Eyes:  Conjunctiva/sclera: Conjunctivae normal.  Cardiovascular:     Rate and Rhythm: Normal rate and regular rhythm.     Heart sounds: No murmur heard. Pulmonary:     Effort: Pulmonary effort is normal. No respiratory distress.     Breath sounds: Normal breath sounds.  Abdominal:     Palpations: Abdomen is soft.     Tenderness: There is no abdominal tenderness.  Musculoskeletal:        General: Tenderness present. No swelling.     Cervical back: Neck supple.  Skin:    General: Skin is warm and dry.     Capillary Refill: Capillary refill takes less than 2 seconds.  Neurological:     Mental Status: She is alert.     Sensory: Sensory deficit present.  Psychiatric:        Mood and Affect: Mood normal.    ED Results and Treatments Labs (all labs ordered are listed, but only abnormal results are displayed) Labs Reviewed - No data to display                                                                                                                        Radiology No results found.  Pertinent labs & imaging results that were available during my care of the patient were reviewed by me and considered in my medical decision making (see MDM for details).  Medications Ordered in ED Medications  naproxen (NAPROSYN) tablet 375 mg (375 mg Oral Given 04/14/21 0735)                                                                                                                                      Procedures Procedures  (including critical care time)  Medical Decision Making / ED Course   This patient presents to the ED for concern of hand pain and numbness, this involves an extensive number of treatment options, and is a complaint that carries with it a high risk of complications and morbidity.  The differential diagnosis includes carpal tunnel, cubital tunnel syndrome, median neuropraxia, epicondylitis  MDM: Patient seen in the emergency department for evaluation of hand pain and numbness.  Physical exam reveals mild tenderness at the back of the tricep, tenderness to the medial epicondyle, significant tenderness at the carpal tunnel, mild decrease sensation to the dorsal aspect of the third digit, difficulty making a fist on the right.  Tinel  test positive.  High suspicion for median neuropraxia and likely carpal tunnel due to the nature of the patient's work.  She will be placed in a wrist brace and given naproxen.  She was instructed to follow-up outpatient with hand.  Resources given endocrinopathy and naproxen prescription given.  Patient then discharged with outpatient orthopedic follow-up.   Additional history obtained:  -External records from outside source obtained and reviewed including: Chart review including previous notes, labs, imaging, consultation notes   Medicines ordered and prescription drug management: Meds ordered this encounter  Medications   naproxen (NAPROSYN) tablet 375 mg   naproxen (NAPROSYN) 375 MG tablet    Sig: Take 1 tablet (375 mg total) by mouth 2 (two) times daily.    Dispense:  20 tablet    Refill:  0    -I have reviewed the patients home medicines and have made adjustments as needed  Critical interventions none   Cardiac Monitoring: The patient was maintained on a cardiac monitor.  I personally viewed and  interpreted the cardiac monitored which showed an underlying rhythm of: NSR  Social Determinants of Health:  Factors impacting patients care include: non-english speaking   Reevaluation: After the interventions noted above, I reevaluated the patient and found that they have :improved  Co morbidities that complicate the patient evaluation  Past Medical History:  Diagnosis Date   Medical history non-contributory       Dispostion: I considered admission for this patient, but her clinical history is consistent with carpal tunnel or median nerve neuropraxia.  This is safe for outpatient work-up and does not meet inpatient criteria.     Final Clinical Impression(s) / ED Diagnoses Final diagnoses:  Neuropraxia of median nerve, right, initial encounter     @PCDICTATION @    Nikitas Davtyan, , MD 04/14/21 [phone]

## 2021-04-14 NOTE — ED Triage Notes (Signed)
Ambulatory to ED with c/o pain and tinging in R arm from mid-bicep down to middle finger x 2 weeks.

## 2021-07-27 ENCOUNTER — Emergency Department (HOSPITAL_COMMUNITY)
Admission: EM | Admit: 2021-07-27 | Discharge: 2021-07-27 | Disposition: A | Discharge status: Home / Self Care | Payer: Self-pay

## 2021-07-27 NOTE — ED Notes (Signed)
No answer for triage after name being called x3.

## 2021-10-14 ENCOUNTER — Emergency Department (HOSPITAL_COMMUNITY): Payer: Self-pay

## 2021-10-14 ENCOUNTER — Encounter (HOSPITAL_COMMUNITY): Payer: Self-pay

## 2021-10-14 ENCOUNTER — Other Ambulatory Visit: Payer: Self-pay

## 2021-10-14 ENCOUNTER — Emergency Department (HOSPITAL_COMMUNITY)
Admission: EM | Admit: 2021-10-14 | Discharge: 2021-10-14 | Disposition: A | Discharge status: Home / Self Care | Payer: Self-pay | Attending: Student | Admitting: Student

## 2021-10-14 DIAGNOSIS — U071 COVID-19: Secondary | ICD-10-CM | POA: Insufficient documentation

## 2021-10-14 LAB — SARS CORONAVIRUS 2 BY RT PCR: SARS Coronavirus 2 by RT PCR: POSITIVE — AB

## 2021-10-14 MED ORDER — ACETAMINOPHEN 325 MG PO TABS: 650.0000 mg | ORAL_TABLET | Freq: Once | ORAL | Status: DC | PRN

## 2021-10-14 MED ORDER — BENZONATATE 100 MG PO CAPS: 100.0000 mg | ORAL_CAPSULE | Freq: Three times a day (TID) | ORAL | 0 refills | Status: AC

## 2021-10-14 MED ORDER — ACETAMINOPHEN 500 MG PO TABS
1000.0000 mg | ORAL_TABLET | Freq: Once | ORAL | Status: AC
  Administered 2021-10-14: 1000 mg via ORAL
  Filled 2021-10-14: qty 2

## 2021-10-14 MED ORDER — ACETAMINOPHEN 500 MG PO TABS: 500.0000 mg | ORAL_TABLET | Freq: Four times a day (QID) | ORAL | 0 refills | Status: AC | PRN

## 2021-10-14 NOTE — ED Notes (Signed)
Pt verbalizes understanding of discharge instructions. Opportunity for questions and answers were provided. Pt discharged from the ED.   ?

## 2021-10-14 NOTE — ED Triage Notes (Signed)
Reports having headache, congestion and fever that started yesterday.  Reports daughter diagnosed with covid on fridxay.

## 2021-10-14 NOTE — ED Notes (Signed)
Patient took tylenol 2hr pta

## 2021-10-14 NOTE — Discharge Instructions (Addendum)
You have been diagnosed with covid infection.  Follow instruction below.   Recommendations for at home COVID-19 symptoms management:  Please continue isolation at home for 1 week.  If have acute worsening of symptoms please go to ER/urgent care for further evaluation. Check pulse oximetry and if below 90-92% please go to ER. The following supplements MAY help:  Vitamin C 500mg  twice a day and Quercetin 250-500 mg twice a day Vitamin D3 2000 - 4000 u/day B Complex vitamins Zinc 75-100 mg/day Melatonin 6-10 mg at night (the optimal dose is unknown) Aspirin 81mg /day (if no history of bleeding issues)   Le han diagnosticado una infeccin por covid. Siga las instrucciones a continuacin.  Recomendaciones para el manejo de los sntomas de COVID-19 en casa: 1. Contine el aislamiento en casa durante 1 semana.  2. Si tiene un empeoramiento agudo de los sntomas, vaya a la sala de emergencias/atencin de urgencia para una evaluacin adicional. 3. Verifique la oximetra de pulso y, si est por debajo del 90-92 %, vaya a urgencias. 4. Los siguientes suplementos PUEDEN ayudar: o Vitamina C 500 mg dos veces al da y 8-10 250-500 mg al da o Vitamina D3 2000 - 4000 u/da o Vitaminas del complejo B o Zinc 75-100 mg/da o Melatonina 6-10 mg por la noche (se desconoce la dosis ptima) o Aspirina 81 mg/da (si no hay antecedentes de problemas de sangrado)

## 2021-10-14 NOTE — ED Provider Notes (Signed)
[facility] EMERGENCY DEPARTMENT Provider Note   CSN: [id number] Arrival date & time: 10/14/21  4098     History  Chief Complaint  Patient presents with   URI    Anna Freeman is a 26 y.o. female.  The history is provided by the patient. The history is limited by a language barrier.  URI    26 year old Hispanic speaking female presenting with COVID symptoms.  Home Medications Prior to Admission medications   Medication Sig Start Date End Date Taking? Authorizing Provider  naproxen (NAPROSYN) 375 MG tablet Take 1 tablet (375 mg total) by mouth 2 (two) times daily. 04/14/21   Kommor, Wyn Forster, MD      Allergies    Patient has no known allergies.    Review of Systems   Review of Systems  All other systems reviewed and are negative.   Physical Exam Updated Vital Signs BP (!) 135/92 (BP Location: Right Arm)   Pulse (!) 108   Temp 99.2 F (37.3 C) (Oral)   Resp 18   Ht 5' (1.524 m)   Wt 66.2 kg   SpO2 98%   BMI 28.51 kg/m  Physical Exam Vitals and nursing note reviewed.  Constitutional:      General: She is not in acute distress.    Appearance: She is well-developed.  HENT:     Head: Atraumatic.  Eyes:     Conjunctiva/sclera: Conjunctivae normal.  Pulmonary:     Effort: Pulmonary effort is normal.  Musculoskeletal:     Cervical back: Neck supple.  Skin:    Findings: No rash.  Neurological:     Mental Status: She is alert.  Psychiatric:        Mood and Affect: Mood normal.     ED Results / Procedures / Treatments   Labs (all labs ordered are listed, but only abnormal results are displayed) Labs Reviewed  SARS CORONAVIRUS 2 BY RT PCR - Abnormal; Notable for the following components:      Result Value   SARS Coronavirus 2 by RT PCR POSITIVE (*)    All other components within normal limits    EKG None  Radiology DG Chest 2 View  Result Date: 10/14/2021 CLINICAL DATA:  Provided history: Cough. EXAM: CHEST - 2 VIEW  COMPARISON:  Prior chest radiograph 12/27/2020 and earlier. FINDINGS: Heart size within normal limits. No appreciable airspace consolidation. No evidence of pleural effusion or pneumothorax. No acute bony abnormality identified. IMPRESSION: No evidence of active cardiopulmonary disease. Electronically Signed   By: Jackey Loge D.O.   On: 10/14/2021 09:12    Procedures Procedures    Medications Ordered in ED Medications  acetaminophen (TYLENOL) tablet 1,000 mg (has no administration in time range)    ED Course/ Medical Decision Making/ A&P                           Medical Decision Making Amount and/or Complexity of Data Reviewed Radiology: ordered.  Risk OTC drugs. Prescription drug management.   BP (!) 135/92 (BP Location: Right Arm)   Pulse (!) 108   Temp 99.2 F (37.3 C) (Oral)   Resp 18   Ht 5' (1.524 m)   Wt 66.2 kg   SpO2 98%   BMI 28.51 kg/m   11:36 AM This is a 26 year old female presenting with cold symptoms.  History obtained using language interpreter.  Patient report her daughter was recently diagnosed with COVID infection 4 days  ago.  Since yesterday patient has had fever, headache, congestion, nausea vomiting and cough.  Symptoms moderate in severity.  No specific treatment tried.  She is not up-to-date with COVID vaccination.  No history of alcohol or tobacco use.  She denies any significant pulmonary disease.  On exam this is a well-appearing female appears to be in no acute discomfort.  She is currently coughing in the room.  Vital signs remarkable for mild tachycardia with heart rate of 108.  Oral temperature is 99.2.  Blood pressure is 135/92 and her oxygen level was 98% on room air.  Heart lung sounds normal.  Abdomen is soft nontender.  Labs and imaging obtained independently viewed interpreted by me and I agree with radiologist interpretation.  Patient test positive for COVID infection.  Chest x-ray fortunately did not show any active cardiopulmonary  disease.  Patient was given Tylenol with improvement of her symptoms.  Patient does not have any significant risk factors to warrant treating with Paxlovid with steroid.  I will provide patient with symptomatic care, work note to have a week off for isolation and return precaution.  Patient is currently not pregnant.  Return precaution discussed.  Medication prescribed including Tylenol as needed for fever, Tessalon Perles for cough.  Also recommend over-the-counter medication such as aspirin, vitamin B, C and D, zinc, melatonin.        Final Clinical Impression(s) / ED Diagnoses Final diagnoses:  COVID-19    Rx / DC Orders ED Discharge Orders          Ordered    benzonatate (TESSALON) 100 MG capsule  Every 8 hours        10/14/21 1139    acetaminophen (TYLENOL) 500 MG tablet  Every 6 hours PRN        10/14/21 1139              Fayrene Helper, PA-C 10/14/21 1205    Kommor, Wyn Forster, MD 10/14/21 1214

## 2021-12-21 ENCOUNTER — Other Ambulatory Visit: Payer: Self-pay

## 2021-12-21 ENCOUNTER — Encounter (HOSPITAL_COMMUNITY): Payer: Self-pay | Admitting: *Deleted

## 2021-12-21 ENCOUNTER — Emergency Department (HOSPITAL_COMMUNITY)
Admission: EM | Admit: 2021-12-21 | Discharge: 2021-12-21 | Disposition: A | Discharge status: Home / Self Care | Payer: Self-pay | Attending: Emergency Medicine | Admitting: Emergency Medicine

## 2021-12-21 DIAGNOSIS — J028 Acute pharyngitis due to other specified organisms: Secondary | ICD-10-CM | POA: Insufficient documentation

## 2021-12-21 DIAGNOSIS — J029 Acute pharyngitis, unspecified: Secondary | ICD-10-CM

## 2021-12-21 DIAGNOSIS — Z20822 Contact with and (suspected) exposure to covid-19: Secondary | ICD-10-CM | POA: Insufficient documentation

## 2021-12-21 DIAGNOSIS — B9789 Other viral agents as the cause of diseases classified elsewhere: Secondary | ICD-10-CM | POA: Insufficient documentation

## 2021-12-21 DIAGNOSIS — H66002 Acute suppurative otitis media without spontaneous rupture of ear drum, left ear: Secondary | ICD-10-CM | POA: Insufficient documentation

## 2021-12-21 LAB — RESP PANEL BY RT-PCR (FLU A&B, COVID) ARPGX2
Influenza A by PCR: NEGATIVE
Influenza B by PCR: NEGATIVE
SARS Coronavirus 2 by RT PCR: NEGATIVE

## 2021-12-21 LAB — GROUP A STREP BY PCR: Group A Strep by PCR: NOT DETECTED

## 2021-12-21 MED ORDER — IBUPROFEN 400 MG PO TABS
600.0000 mg | ORAL_TABLET | Freq: Once | ORAL | Status: AC
  Administered 2021-12-21: 600 mg via ORAL
  Filled 2021-12-21: qty 1

## 2021-12-21 MED ORDER — AMOXICILLIN-POT CLAVULANATE 875-125 MG PO TABS: 1.0000 | ORAL_TABLET | Freq: Two times a day (BID) | ORAL | 0 refills | Status: AC

## 2021-12-21 MED ORDER — AMOXICILLIN-POT CLAVULANATE 875-125 MG PO TABS
1.0000 | ORAL_TABLET | Freq: Once | ORAL | Status: AC
  Administered 2021-12-21: 1 via ORAL
  Filled 2021-12-21: qty 1

## 2021-12-21 NOTE — ED Provider Notes (Signed)
[facility] EMERGENCY DEPARTMENT Provider Note   CSN: [id number] Arrival date & time: 12/21/21  6389     History  Chief Complaint  Patient presents with   Sore Throat   History provided by the patient and the assistance of Spanish interpreter [place] Be[place].  Anna Freeman is a 26 y.o. female who presents for 4 days of sore throat and left ear pain x2 days as well as fever in the evenings.  Erythema in the left eye times today.  I personally read the patient's medical records.  He is in agreement with theunderstanding medications daily.  Denies known ill contacts.  Taking over-the-counter medications as well as Tylenol for symptom management.  HPI     Home Medications Prior to Admission medications   Medication Sig Start Date End Date Taking? Authorizing Provider  acetaminophen (TYLENOL) 500 MG tablet Take 1 tablet (500 mg total) by mouth every 6 (six) hours as needed. 10/14/21   Fayrene Helper, PA-C  benzonatate (TESSALON) 100 MG capsule Take 1 capsule (100 mg total) by mouth every 8 (eight) hours. 10/14/21   Fayrene Helper, PA-C  naproxen (NAPROSYN) 375 MG tablet Take 1 tablet (375 mg total) by mouth 2 (two) times daily. 04/14/21   Kommor, Wyn Forster, MD      Allergies    Patient has no known allergies.    Review of Systems   Review of Systems  Constitutional:  Positive for chills, fatigue and fever.  HENT:  Positive for congestion, ear pain, rhinorrhea and sore throat. Negative for trouble swallowing.   Respiratory: Negative.    Cardiovascular: Negative.   Gastrointestinal: Negative.   Genitourinary: Negative.     Physical Exam Updated Vital Signs BP (!) 124/90   Pulse 75   Temp (!) 97.5 F (36.4 C) (Oral)   Resp 16   SpO2 99%  Physical Exam Vitals and nursing note reviewed.  Constitutional:      Appearance: She is not ill-appearing or toxic-appearing.  HENT:     Head: Normocephalic and atraumatic.     Right Ear: Tympanic membrane, ear canal and  external ear normal.     Left Ear: Ear canal and external ear normal. A middle ear effusion is present. Tympanic membrane is erythematous and bulging. Tympanic membrane is not injected, scarred or perforated.     Ears:     Comments: Suppurative middle ear effusion on the left.    Nose: Congestion present.     Mouth/Throat:     Mouth: Mucous membranes are moist.     Pharynx: Oropharynx is clear. Uvula midline. No oropharyngeal exudate or posterior oropharyngeal erythema.     Tonsils: No tonsillar exudate. 2+ on the right. 2+ on the left.  Eyes:     General: Lids are normal. Vision grossly intact.        Right eye: No discharge.        Left eye: No discharge.     Extraocular Movements: Extraocular movements intact.     Conjunctiva/sclera:     Left eye: Left conjunctiva is injected. No chemosis. Neck:     Trachea: Trachea and phonation normal.  Cardiovascular:     Rate and Rhythm: Normal rate and regular rhythm.     Pulses: Normal pulses.     Heart sounds: Normal heart sounds. No murmur heard. Pulmonary:     Effort: Pulmonary effort is normal. No tachypnea, bradypnea, accessory muscle usage, prolonged expiration or respiratory distress.     Breath sounds: Normal breath sounds.  No wheezing or rales.  Chest:     Chest wall: No mass, lacerations, deformity, swelling, tenderness, crepitus or edema.  Abdominal:     General: Bowel sounds are normal. There is no distension.     Palpations: Abdomen is soft.     Tenderness: There is no abdominal tenderness. There is no right CVA tenderness, left CVA tenderness, guarding or rebound.  Musculoskeletal:        General: No deformity.     Cervical back: Normal range of motion and neck supple.     Right lower leg: No edema.     Left lower leg: No edema.  Lymphadenopathy:     Cervical: Cervical adenopathy present.     Right cervical: Superficial cervical adenopathy present.     Left cervical: Superficial cervical adenopathy present.  Skin:     General: Skin is warm and dry.     Capillary Refill: Capillary refill takes less than 2 seconds.  Neurological:     Mental Status: She is alert. Mental status is at baseline.  Psychiatric:        Mood and Affect: Mood normal.    ED Results / Procedures / Treatments   Labs (all labs ordered are listed, but only abnormal results are displayed) Labs Reviewed  RESP PANEL BY RT-PCR (FLU A&B, COVID) [id number]  GROUP A STREP BY PCR    EKG None  Radiology No results found.  Procedures Procedures    Medications Ordered in ED Medications  ibuprofen (ADVIL) tablet 600 mg (600 mg Oral Given 12/21/21 0404)    ED Course/ Medical Decision Making/ A&P                           Medical Decision Making 26 year old female presents with sore throat and left ear pain as well as congestion.  Vital signs normal intake.  Cardiopulmonary seems normal, abdominal exam is benign.  Patient oropharyngeal exam as above with findings concerning for pharyngitis but without concern for peritonsillar abscess or retropharyngeal abscess.  Patient with left otitis media with suppurative middle ear effusion without perforation.  DDx includes those listed above but also includes and is not limited to viral URI  Amount and/or Complexity of Data Reviewed Labs:     Details: Strep test is negative, COVID and flu tests are negative.   Clinical picture most consistent with acute viral URI, with conjunctivitis on the left. Additionally, patient does have suppurative middle ear effusion on the left.  Her decision-making regarding the role of antibiotics and treatment of this otitis media.  Patient voiced understanding of her treatment options and has persisted to receive antibiotics at this time.  First dose administered in ED and patient discharged with prescription for antibiotics as well.  No further work-up warranted in ED at this time.  Clinical concern for emergent underlying etiology that warrant further ED  work-up or patient management is exceedingly low.  [name] voiced understanding of her medical evaluation and treatment plan. Each of their questions answered to their expressed satisfaction.  Return precautions were given.  Patient is well-appearing, stable, and was discharged in good condition.  This chart was dictated using voice recognition software, Dragon. Despite the best efforts of this provider to proofread and correct errors, errors may still occur which can change documentation meaning.  Final Clinical Impression(s) / ED Diagnoses Final diagnoses:  Viral pharyngitis  Non-recurrent acute suppurative otitis media of left ear without spontaneous rupture of tympanic membrane  Rx / DC Orders ED Discharge Orders     None         Aura Dials 12/21/21 [place], [place], DO 12/21/21 [phone]

## 2021-12-21 NOTE — Discharge Instructions (Signed)
You are seen in the ER today for your throat and ear pain.  You likely had a viral illness causing your sore throat and congestion.  Unfortunately you do have an ear infection on the left.  While this likely started as viral due to the pressure behind your left eardrum, we will treat you with antibiotics.  Please take these as prescribed the entire course.  You may use Tylenol or ibuprofen as needed for your discomfort at home.  Follow-up with your primary care doctor and return to the ER with any new severe symptoms.

## 2021-12-21 NOTE — ED Triage Notes (Signed)
Pt states that she has had a sore throat for about 4 days, pain the the left ear for about 2 days, redness in the eyes that started tonight.

## 2022-06-30 ENCOUNTER — Institutional Professional Consult (permissible substitution): Payer: Self-pay | Admitting: Plastic Surgery

## 2022-07-03 IMAGING — CR DG CHEST 2V
2 series · 2 of 2 positions shown · non-contrast
Comparison: 04/29/2018

CLINICAL DATA: Chest pain

EXAM:
CHEST - 2 VIEW

[chest pa]
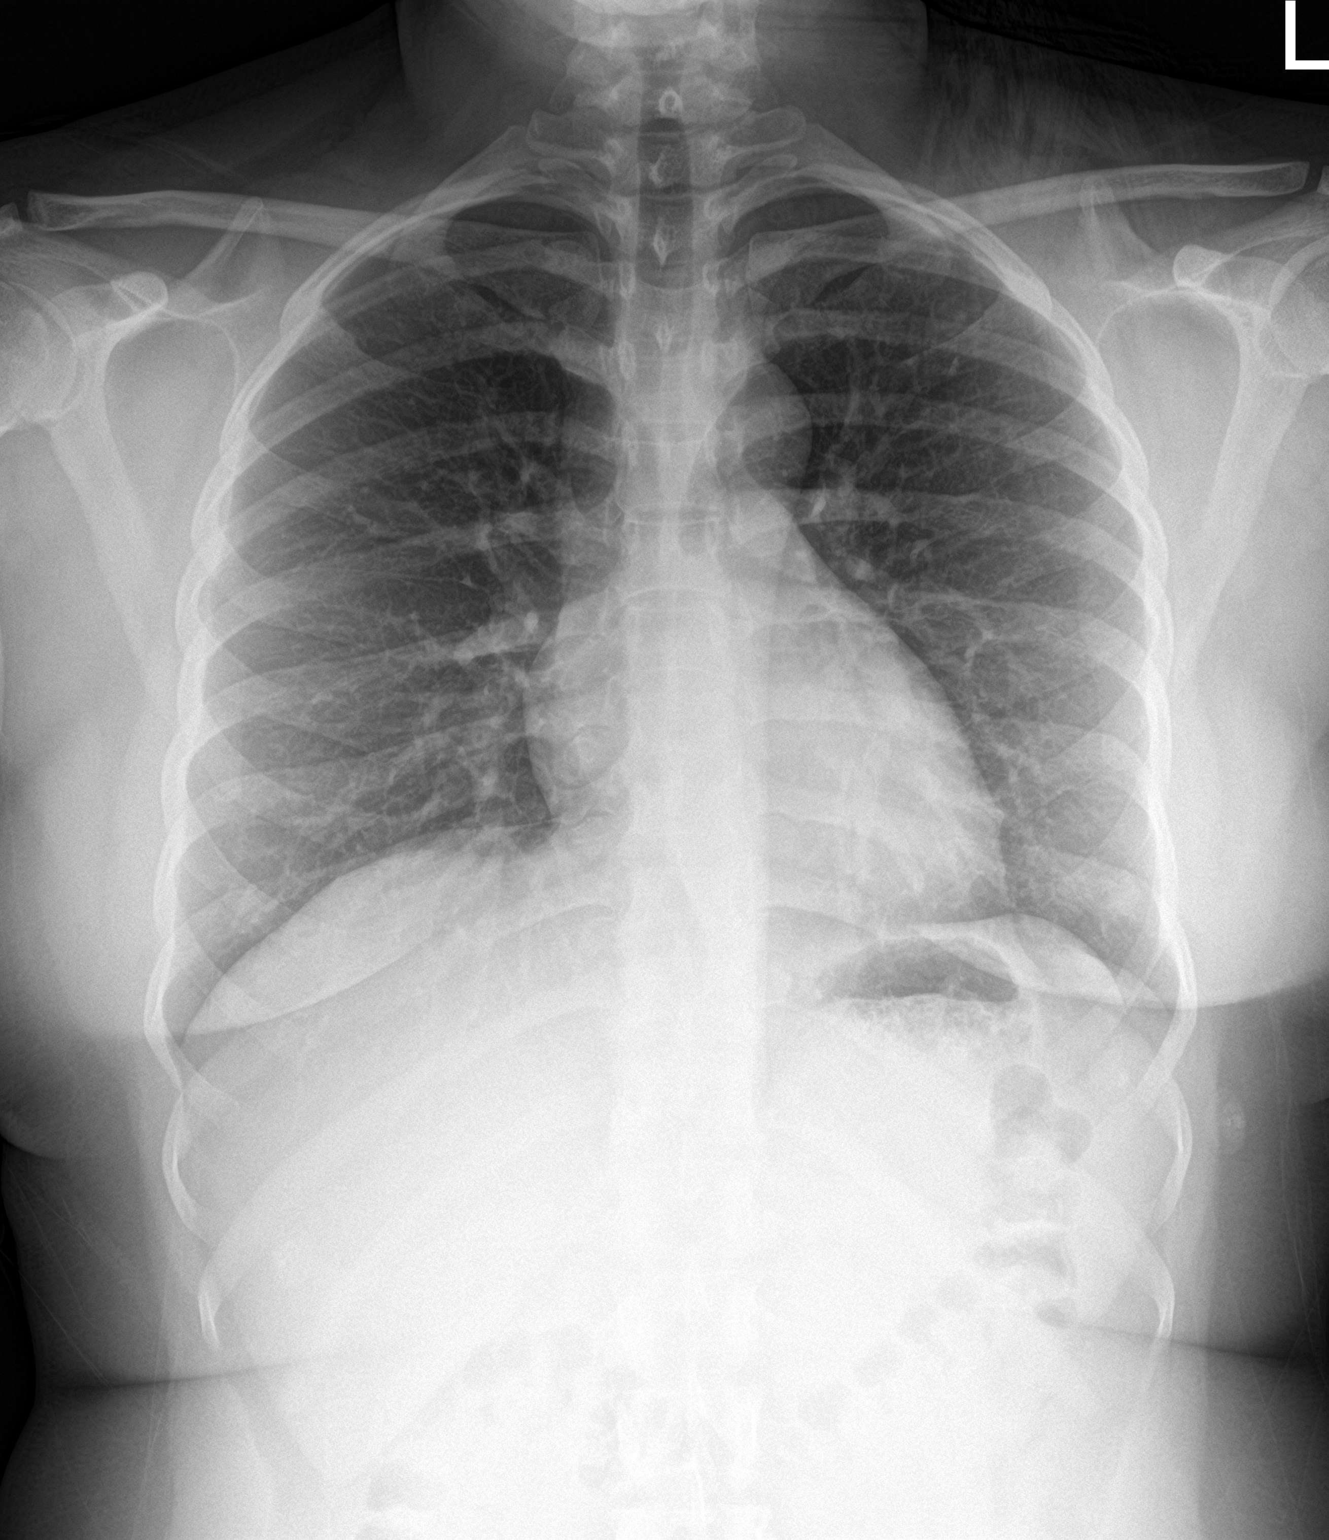

[chest lat]
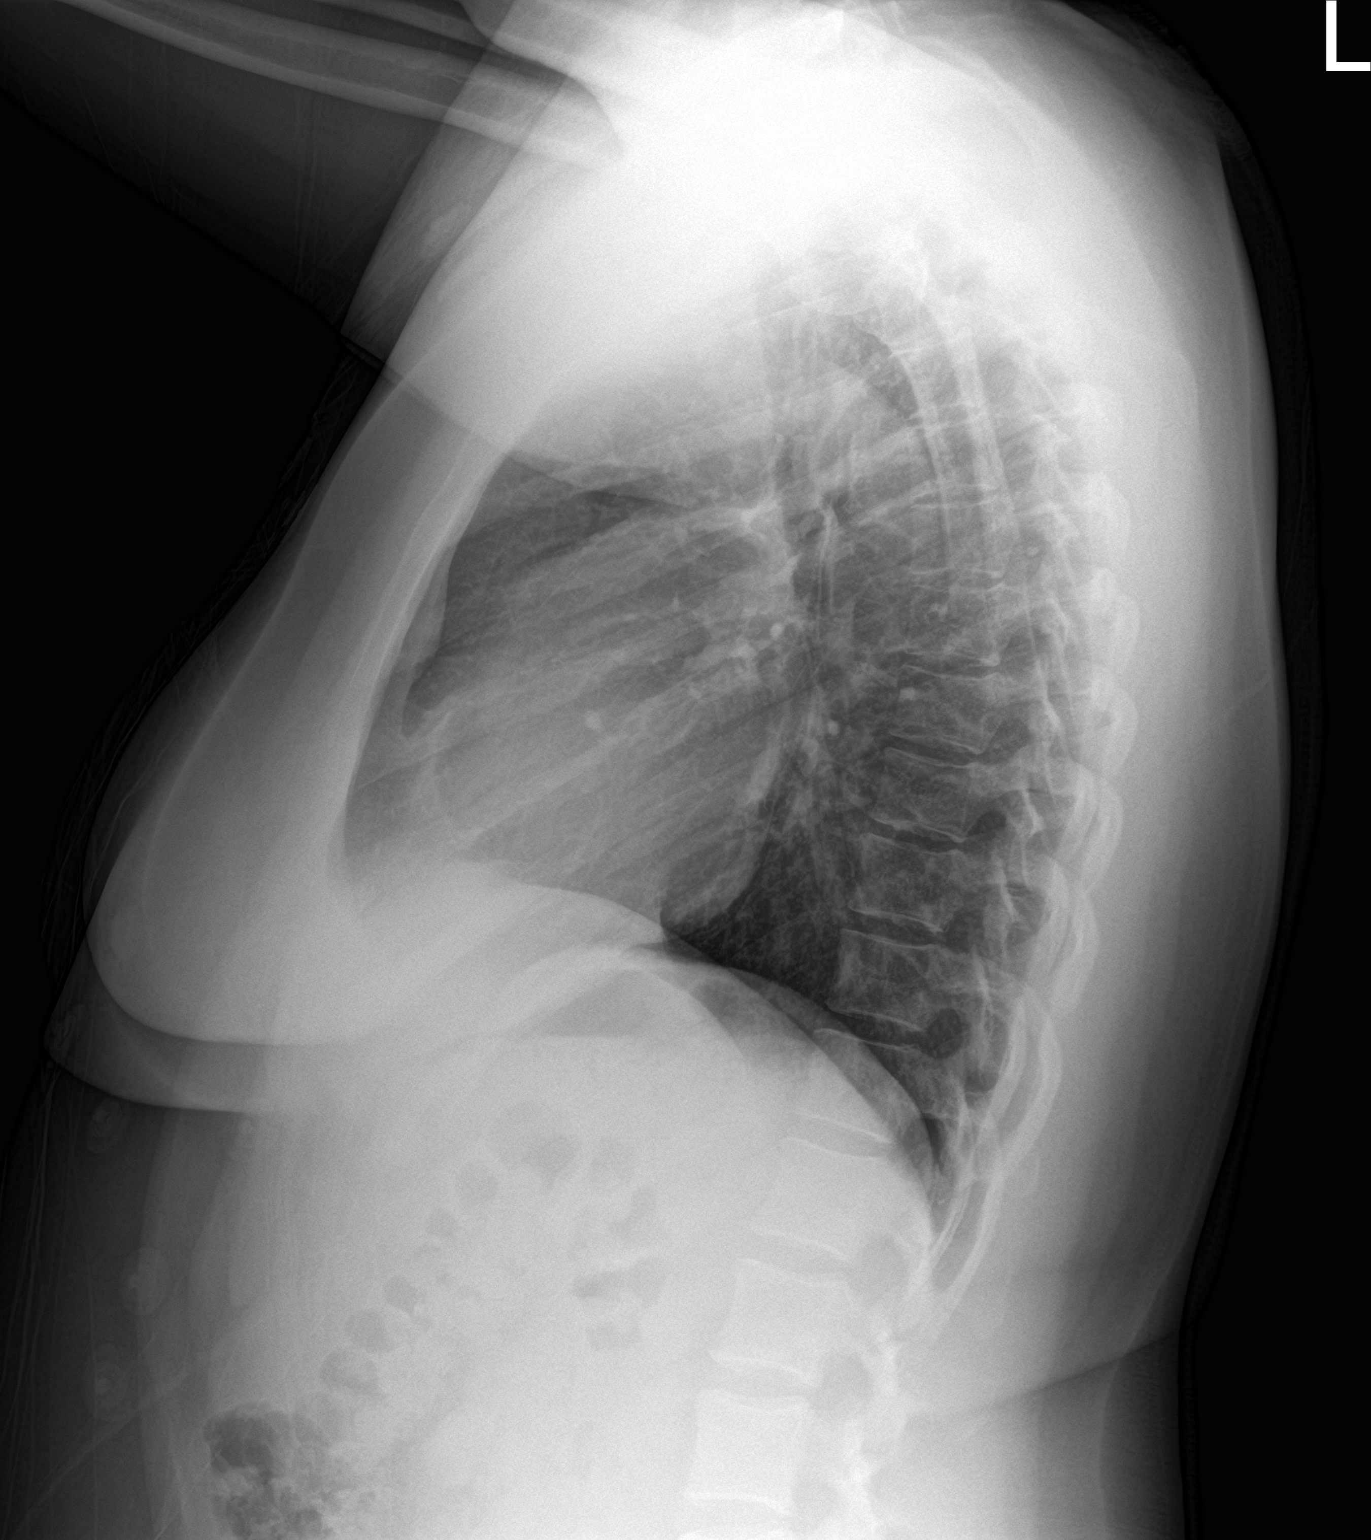

[2 of 2 positions shown; findings below may reference images not displayed]

FINDINGS: The heart size and mediastinal contours are within normal limits.
Both lungs are clear. The visualized skeletal structures are
unremarkable.
IMPRESSION: No active cardiopulmonary disease.

## 2023-06-01 ENCOUNTER — Encounter (HOSPITAL_COMMUNITY): Payer: Self-pay | Admitting: Emergency Medicine

## 2023-06-01 ENCOUNTER — Emergency Department (HOSPITAL_COMMUNITY)
Admission: EM | Admit: 2023-06-01 | Discharge: 2023-06-01 | Disposition: A | Discharge status: Home / Self Care | Payer: Self-pay | Attending: Emergency Medicine | Admitting: Emergency Medicine

## 2023-06-01 ENCOUNTER — Emergency Department (HOSPITAL_COMMUNITY): Payer: Self-pay

## 2023-06-01 ENCOUNTER — Other Ambulatory Visit: Payer: Self-pay

## 2023-06-01 DIAGNOSIS — N3000 Acute cystitis without hematuria: Secondary | ICD-10-CM | POA: Insufficient documentation

## 2023-06-01 LAB — URINALYSIS, ROUTINE W REFLEX MICROSCOPIC
Bilirubin Urine: NEGATIVE
Glucose, UA: NEGATIVE mg/dL
Hgb urine dipstick: NEGATIVE
Ketones, ur: NEGATIVE mg/dL
Nitrite: NEGATIVE
Protein, ur: 100 mg/dL — AB
Specific Gravity, Urine: 1.03 (ref 1.005–1.030)
pH: 5 (ref 5.0–8.0)

## 2023-06-01 LAB — COMPREHENSIVE METABOLIC PANEL WITH GFR
ALT: 28 U/L (ref 0–44)
AST: 21 U/L (ref 15–41)
Albumin: 3.7 g/dL (ref 3.5–5.0)
Alkaline Phosphatase: 72 U/L (ref 38–126)
Anion gap: 8 (ref 5–15)
BUN: 9 mg/dL (ref 6–20)
CO2: 22 mmol/L (ref 22–32)
Calcium: 9.5 mg/dL (ref 8.9–10.3)
Chloride: 109 mmol/L (ref 98–111)
Creatinine, Ser: 0.71 mg/dL (ref 0.44–1.00)
GFR, Estimated: >60 mL/min (ref >60)
Glucose, Bld: 92 mg/dL (ref 70–99)
Potassium: 3.8 mmol/L (ref 3.5–5.1)
Sodium: 139 mmol/L (ref 135–145)
Total Bilirubin: 0.8 mg/dL (ref 0.0–1.2)
Total Protein: 6.8 g/dL (ref 6.5–8.1)

## 2023-06-01 LAB — CBC WITH DIFFERENTIAL/PLATELET
Abs Immature Granulocytes: 0.03 10*3/uL (ref 0.00–0.07)
Basophils Absolute: 0 10*3/uL (ref 0.0–0.1)
Basophils Relative: 0 %
Eosinophils Absolute: 0.2 10*3/uL (ref 0.0–0.5)
Eosinophils Relative: 2 %
HCT: 42.5 % (ref 36.0–46.0)
Hemoglobin: 14.2 g/dL (ref 12.0–15.0)
Immature Granulocytes: 0 %
Lymphocytes Relative: 36 %
Lymphs Abs: 3.1 10*3/uL (ref 0.7–4.0)
MCH: 29.6 pg (ref 26.0–34.0)
MCHC: 33.4 g/dL (ref 30.0–36.0)
MCV: 88.7 fL (ref 80.0–100.0)
Monocytes Absolute: 0.6 10*3/uL (ref 0.1–1.0)
Monocytes Relative: 7 %
Neutro Abs: 4.7 10*3/uL (ref 1.7–7.7)
Neutrophils Relative %: 55 %
Platelets: 237 10*3/uL (ref 150–400)
RBC: 4.79 MIL/uL (ref 3.87–5.11)
RDW: 12 % (ref 11.5–15.5)
WBC: 8.6 10*3/uL (ref 4.0–10.5)
nRBC: 0 % (ref 0.0–0.2)

## 2023-06-01 LAB — PREGNANCY, URINE: Preg Test, Ur: NEGATIVE

## 2023-06-01 MED ORDER — CEFADROXIL 500 MG PO CAPS: 1000.0000 mg | ORAL_CAPSULE | Freq: Two times a day (BID) | ORAL | 0 refills | Status: AC

## 2023-06-01 MED ORDER — OXYCODONE-ACETAMINOPHEN 5-325 MG PO TABS
1.0000 | ORAL_TABLET | Freq: Once | ORAL | Status: AC
  Administered 2023-06-01: 1 via ORAL
  Filled 2023-06-01: qty 1

## 2023-06-01 NOTE — ED Provider Notes (Signed)
 [place] EMERGENCY DEPARTMENT AT [facility] Provider Note   CSN: [id number] Arrival date & time: 06/01/23  0453     History Chief Complaint  Patient presents with   Flank Pain    Anna Freeman is a 28 y.o. female.  Patient without significant medical history presents emergency department concerns of left-sided flank pain for the last week.  Reports some increased urinary frequency denies any dysuria or hematuria.  No prior history of kidney stones.  Denies any nausea, vomiting or diarrhea.  States that pain is primarily worsened with sitting and some movement.  Denies any history of IV drug use, recent back injury or trauma, or prolonged period of immobilization.   Flank Pain       Home Medications Prior to Admission medications   Medication Sig Start Date End Date Taking? Authorizing Provider  cefadroxil (DURICEF) 500 MG capsule Take 2 capsules (1,000 mg total) by mouth 2 (two) times daily for 7 days. 06/01/23 06/08/23 Yes Smitty Knudsen, PA-C  acetaminophen (TYLENOL) 500 MG tablet Take 1 tablet (500 mg total) by mouth every 6 (six) hours as needed. 10/14/21   Fayrene Helper, PA-C  amoxicillin-clavulanate (AUGMENTIN) 875-125 MG tablet Take 1 tablet by mouth every 12 (twelve) hours. 12/21/21   Sponseller, Lupe Carney R, PA-C  benzonatate (TESSALON) 100 MG capsule Take 1 capsule (100 mg total) by mouth every 8 (eight) hours. 10/14/21   Fayrene Helper, PA-C  naproxen (NAPROSYN) 375 MG tablet Take 1 tablet (375 mg total) by mouth 2 (two) times daily. 04/14/21   Kommor, Wyn Forster, MD      Allergies    Patient has no known allergies.    Review of Systems   Review of Systems  Genitourinary:  Positive for flank pain.  All other systems reviewed and are negative.   Physical Exam Updated Vital Signs BP 122/87   Pulse (!) 57   Temp 97.6 F (36.4 C) (Oral)   Resp 14   Wt 67 kg   SpO2 100%   BMI 28.85 kg/m  Physical Exam Vitals and nursing note reviewed.   Constitutional:      General: She is not in acute distress.    Appearance: She is well-developed.  HENT:     Head: Normocephalic and atraumatic.  Eyes:     Conjunctiva/sclera: Conjunctivae normal.  Cardiovascular:     Rate and Rhythm: Normal rate and regular rhythm.     Heart sounds: No murmur heard. Pulmonary:     Effort: Pulmonary effort is normal. No respiratory distress.     Breath sounds: Normal breath sounds.  Abdominal:     General: Abdomen is flat. Bowel sounds are normal. There is no distension.     Palpations: Abdomen is soft.     Tenderness: There is no abdominal tenderness. There is left CVA tenderness. There is no right CVA tenderness or guarding.  Musculoskeletal:        General: No swelling.     Cervical back: Neck supple.  Skin:    General: Skin is warm and dry.     Capillary Refill: Capillary refill takes less than 2 seconds.  Neurological:     Mental Status: She is alert.  Psychiatric:        Mood and Affect: Mood normal.     ED Results / Procedures / Treatments   Labs (all labs ordered are listed, but only abnormal results are displayed) Labs Reviewed  URINALYSIS, ROUTINE W REFLEX MICROSCOPIC - Abnormal; Notable for the  following components:      Result Value   Color, Urine AMBER (*)    APPearance CLOUDY (*)    Protein, ur 100 (*)    Leukocytes,Ua MODERATE (*)    Bacteria, UA MANY (*)    All other components within normal limits  CBC WITH DIFFERENTIAL/PLATELET  COMPREHENSIVE METABOLIC PANEL WITH GFR  PREGNANCY, URINE    EKG None  Radiology CT Renal Stone Study Result Date: 06/01/2023 CLINICAL DATA:  Abdominal/flank pain with stone suspected EXAM: CT ABDOMEN AND PELVIS WITHOUT CONTRAST TECHNIQUE: Multidetector CT imaging of the abdomen and pelvis was performed following the standard protocol without IV contrast. RADIATION DOSE REDUCTION: This exam was performed according to the departmental dose-optimization program which includes automated  exposure control, adjustment of the mA and/or kV according to patient size and/or use of iterative reconstruction technique. COMPARISON:  None Available. FINDINGS: Lower chest:  No contributory findings. Hepatobiliary: No focal liver abnormality.No evidence of biliary obstruction or stone. Pancreas: Unremarkable. Spleen: Unremarkable. Adrenals/Urinary Tract: Negative adrenals. No hydronephrosis or stone. Unremarkable bladder. Stomach/Bowel:  No obstruction. No appendicitis. Vascular/Lymphatic: No acute vascular abnormality. No mass or adenopathy. Reproductive:Cystic density at the left ovary measuring 3.2 cm, usually dominant follicle. Other: No ascites or pneumoperitoneum. Musculoskeletal: No acute abnormalities. IMPRESSION: No acute finding.  No hydronephrosis or urolithiasis. Electronically Signed   By: Tiburcio Pea M.D.   On: 06/01/2023 07:27    Procedures Procedures   Medications Ordered in ED Medications  oxyCODONE-acetaminophen (PERCOCET/ROXICET) 5-325 MG per tablet 1 tablet (1 tablet Oral Given 06/01/23 1308)    ED Course/ Medical Decision Making/ A&P                                 Medical Decision Making Amount and/or Complexity of Data Reviewed Labs: ordered. Radiology: ordered.  Risk Prescription drug management.   This patient presents to the ED for concern of flank pain.  Differential diagnosis includes urolithiasis, pyelonephritis, bowel obstruction, diverticulitis, ovarian cyst   Lab Tests:  I Ordered, and personally interpreted labs.  The pertinent results include: CBC unremarkable, CMP unremarkable, urine pregnancy negative, urinalysis consistent with concerns for infection with leukocytes and bacteria seen although no hemoglobin present   Imaging Studies ordered:  I ordered imaging studies including CT renal stone study I independently visualized and interpreted imaging which showed negative for any acute findings I agree with the radiologist  interpretation   Medicines ordered and prescription drug management:  I ordered medication including Percocet for pain Reevaluation of the patient after these medicines showed that the patient improved I have reviewed the patients home medicines and have made adjustments as needed   Problem List / ED Course:  Patient without significant medical history presents to the emergency department today with concerns of flank pain.  Reports she has been having left-sided flank pain for the last week.  Has endorsing increased urinary frequency as well as noticing a "sediment" in her urine but denies any hematuria.  She states that she had a delayed period recently and was concerned for possible pregnancy and has not been sexually active in the last month.  Denies any concerns for STIs.  No recent injury or trauma to the low back to explain current pain.  No history of IV drug use. Physical exam does reveal left-sided CVA tenderness.  The abdomen is generally nontender and nondistended.  Normal bowel sounds.  Will proceed with labs and CT imaging  for renal stone rule out.  Percocet ordered for pain as patient has no line established. CT imaging negative.  UA still again concerning for possible UTI in the setting of flank tenderness as well as suprapubic pain, will start patient on cefadroxil for better renal penetration and the chance that this could be pyelonephritis type UTI.  Advised patient need of antibiotic therapy for the next week and to use as prescribed.  Encourage close follow-up with primary care provider.  Return precautions discussed.  Otherwise stable for outpatient follow-up and discharged home.  Final Clinical Impression(s) / ED Diagnoses Final diagnoses:  Acute cystitis without hematuria    Rx / DC Orders ED Discharge Orders          Ordered    cefadroxil (DURICEF) 500 MG capsule  2 times daily        06/01/23 0801              Azlee Monforte A, PA-C 06/01/23 0810     Lowery Rue, DO 06/01/23 1359

## 2023-06-01 NOTE — ED Triage Notes (Signed)
 Patient c/o left flank pain x 7 days.  Patient denies dysuria, unknown history of kidney stones.

## 2023-06-01 NOTE — Discharge Instructions (Addendum)
 Hoy lo atendieron en urgencias por dolor en el flanco. Sus anlisis e imgenes parecen mostrar signos de infeccin urinaria, lo que podra estar causando Therapist, sports. No hay evidencia de clculos renales en este momento. Tome estos antibiticos segn lo prescrito. Regrese a urgencias si presenta sntomas nuevos o que empeoran.  You were seen in the ER today for concerns of flank pain. Your labs and imaging appear to show signs of urinary infection which could be causing this pain. There is no evidence of a kidney stone at this time. Please take these antibiotics as prescribed. Return to the ER for any concerns of new or worsening symptoms.

## 2023-06-01 NOTE — ED Notes (Signed)
 Patient transported to CT
